# Patient Record
Sex: Male | Born: 1955
Health system: Southern US, Community
[De-identification: ages and names within clinical notes are randomized; demographics above are authoritative.]

## PROBLEM LIST (undated history)

## (undated) DIAGNOSIS — E785 Hyperlipidemia, unspecified: Secondary | ICD-10-CM

## (undated) DIAGNOSIS — F1020 Alcohol dependence, uncomplicated: Secondary | ICD-10-CM

## (undated) DIAGNOSIS — G4730 Sleep apnea, unspecified: Secondary | ICD-10-CM

## (undated) DIAGNOSIS — E669 Obesity, unspecified: Secondary | ICD-10-CM

## (undated) DIAGNOSIS — I10 Essential (primary) hypertension: Secondary | ICD-10-CM

## (undated) DIAGNOSIS — K219 Gastro-esophageal reflux disease without esophagitis: Secondary | ICD-10-CM

## (undated) HISTORY — DX: Essential (primary) hypertension: I10

## (undated) HISTORY — DX: Sleep apnea, unspecified: G47.30

## (undated) HISTORY — PX: COLONOSCOPY: SHX174

## (undated) HISTORY — DX: Hyperlipidemia, unspecified: E78.5

## (undated) HISTORY — DX: Gastro-esophageal reflux disease without esophagitis: K21.9

## (undated) HISTORY — DX: Obesity, unspecified: E66.9

## (undated) HISTORY — DX: Alcohol dependence, uncomplicated: F10.20

---

## 1996-11-29 HISTORY — PX: HERNIA REPAIR: SHX51

## 2010-11-29 LAB — HM COLONOSCOPY

## 2011-09-07 LAB — HEMOCCULT GUIAC POC 1CARD (OFFICE)

## 2011-09-15 ENCOUNTER — Encounter: Payer: Self-pay | Admitting: Internal Medicine

## 2011-09-16 ENCOUNTER — Encounter: Payer: Self-pay | Admitting: Internal Medicine

## 2011-09-20 ENCOUNTER — Encounter: Payer: Self-pay | Admitting: Internal Medicine

## 2011-09-21 ENCOUNTER — Encounter: Payer: Self-pay | Admitting: Internal Medicine

## 2011-09-21 ENCOUNTER — Ambulatory Visit (INDEPENDENT_AMBULATORY_CARE_PROVIDER_SITE_OTHER): Payer: No Typology Code available for payment source | Admitting: Internal Medicine

## 2011-09-21 VITALS — BP 120/68 | HR 74 | Ht 69.0 in | Wt 242.0 lb

## 2011-09-21 DIAGNOSIS — F1011 Alcohol abuse, in remission: Secondary | ICD-10-CM | POA: Insufficient documentation

## 2011-09-21 DIAGNOSIS — I10 Essential (primary) hypertension: Secondary | ICD-10-CM | POA: Insufficient documentation

## 2011-09-21 DIAGNOSIS — E118 Type 2 diabetes mellitus with unspecified complications: Secondary | ICD-10-CM | POA: Insufficient documentation

## 2011-09-21 DIAGNOSIS — E785 Hyperlipidemia, unspecified: Secondary | ICD-10-CM | POA: Insufficient documentation

## 2011-09-21 DIAGNOSIS — G4730 Sleep apnea, unspecified: Secondary | ICD-10-CM | POA: Insufficient documentation

## 2011-09-21 DIAGNOSIS — R197 Diarrhea, unspecified: Secondary | ICD-10-CM

## 2011-09-21 DIAGNOSIS — F1911 Other psychoactive substance abuse, in remission: Secondary | ICD-10-CM | POA: Insufficient documentation

## 2011-09-21 MED ORDER — PEG-KCL-NACL-NASULF-NA ASC-C 100 G PO SOLR: 1.0000 | Freq: Once | ORAL | Status: DC

## 2011-09-21 MED ORDER — LOPERAMIDE HCL 2 MG PO TABS: 2.0000 mg | ORAL_TABLET | Freq: Three times a day (TID) | ORAL | Status: AC | PRN

## 2011-09-21 NOTE — Patient Instructions (Signed)
You have been scheduled for a colonoscopy. Please follow written instructions given to you at your visit today.  Please pick up your prep kit at the pharmacy within the next 2-3 days.  Dr. Rhea Belton would like you to increase your Miralax to three times daily as needed. You can take up to 16mg  daily for loose stool.

## 2011-09-21 NOTE — Progress Notes (Signed)
Subjective:    Patient ID: Patrick Parker, male    DOB: 1956-03-07, 55 y.o.   MRN: [id number]  HPI Patrick Parker is a 55 yo male with PMH of alcoholism/substance abuse in remission x 20 years, hypertension, hyperlipidemia, diabetes, sleep apnea who is seen in consultation at the request of Dr. Alycia Rossetti for evaluation of diarrhea.  The patient states that he's had approximately 8 weeks of diarrhea. He reports this has been life limiting and is occurring 12-14 times during the daytime. He also is happening at night, and he is having between 3 and 6 nocturnal stools. This is interfering with his sleep and caused significant fatigue. He reports his stools are "mustard yellow" but he does occasionally see scant bright red blood which he has attributed to hemorrhoids. He denies melena and hematochezia. He's had no specific abdominal pain with diarrhea, though he does note occasional mild lower cramping. He denies nausea and vomiting. No heartburn, dysphagia or odynophagia. He feels that the diarrhea is specifically worse after eating, and often he will have to rush to the bathroom after eating. He reports about 8 weeks ago he briefly changed his diet in attempt to lose weight and started taking vitamins and supplements from [facility]. He felt like the diarrhea may be related to the diet change and vitamins, thus he discontinued these several weeks ago and the diarrhea has continued. He notes about a 20 pound weight loss and feels maybe 10 pounds of this was intentional. He has used Imodium but only once daily. He feels this helps some but not significantly over the whole day or night. He reports several antibiotic exposures over the last 6-12 months but none just before the diarrhea started.   Review of Systems Constitutional: Negative for fever, chills, night sweats, activity change, appetite change, + fatigue HEENT: Negative for sore throat, mouth sores and trouble swallowing. Eyes: Negative for visual  disturbance Respiratory: Negative for cough, chest tightness, positive for occasional shortness of breath Cardiovascular: Negative for chest pain, palpitations and lower extremity swelling Gastrointestinal: See history of present illness Genitourinary: Negative for dysuria and hematuria. Musculoskeletal: Negative for back pain, arthralgias and myalgias Skin: Negative for rash or color change Neurological: Negative for headaches, weakness, numbness Hematological: Negative for adenopathy, negative for easy bruising/bleeding Psychiatric/behavioral: Negative for depressed mood, negative for anxiety, positive for sleep problem   Past Medical History  Diagnosis Date  . Sleep apnea   . Diabetes mellitus   . Obesity   . Hypertension   . Hyperlipemia   . Alcoholism    Current Outpatient Prescriptions  Medication Sig Dispense Refill  . amLODipine (NORVASC) 10 MG tablet Take 10 mg by mouth daily.        Marland Kitchen aspirin 325 MG tablet Take 325 mg by mouth daily.        Marland Kitchen atenolol (TENORMIN) 50 MG tablet Take 50 mg by mouth daily.        . benazepril (LOTENSIN) 20 MG tablet Take 20 mg by mouth daily.        . Fish Oil OIL Take by mouth. Takes one tablet by mouth as directed       . glipiZIDE (GLUCOTROL) 10 MG tablet Take 10 mg by mouth daily.        . hydrochlorothiazide (HYDRODIURIL) 25 MG tablet Take 25 mg by mouth daily.        Marland Kitchen loperamide (IMODIUM A-D) 2 MG tablet Take 1 tablet (2 mg total) by mouth 3 (three) times daily as  needed (Take as needed up to 16mg  daily for loose stool).  30 tablet  0  . Melatonin 10 MG TABS Take 3 tablets by mouth daily.        . metFORMIN (GLUCOPHAGE) 1000 MG tablet Take 1,000 mg by mouth 2 (two) times daily with a meal.        . Saw Palmetto 450 MG CAPS Take by mouth. Takes one tablet by mouth as directed       . zolpidem (AMBIEN) 10 MG tablet Take 10 mg by mouth at bedtime as needed.        . peg 3350 powder (MOVIPREP) 100 G SOLR Take 1 kit (100 g total) by mouth  once.  1 kit  0   No Known Allergies  Family History  Problem Relation Age of Onset  . Prostate cancer Father   . Colon cancer Neg Hx   --neg for GI malignancy   Social History  . 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 Smoking status: Never Smoker   . Smokeless tobacco: Current User  . Alcohol Use: No     Was a heavy drinker. Quit 20 yrs ago   . Drug Use: No, previous cocaine use/abuse - none x 20 yrs   Social History Narrative   5 caffeine drinks daily       Objective:   Physical Exam BP 120/68  Pulse 74  Ht 5\' 9"  (1.753 m)  Wt 242 lb (109.77 kg)  BMI 35.74 kg/m2  SpO2 96% Constitutional: Well-developed and well-nourished. No distress. HEENT: Normocephalic and atraumatic. Oropharynx is clear and moist. No oropharyngeal exudate. Conjunctivae are normal. Pupils are equal round and reactive to light. No scleral icterus. Neck: Neck supple. Trachea midline. Cardiovascular: Normal rate, regular rhythm and intact distal pulses. No M/R/G Pulmonary/chest: Effort normal and breath sounds normal. No wheezing, rales or rhonchi. Abdominal: Soft, nontender, nondistended. Bowel sounds active throughout. There are no masses palpable. No hepatosplenomegaly. Extremities: no clubbing, cyanosis, or edema Lymphadenopathy: No cervical adenopathy noted. Neurological: Alert and oriented to person place and time. Skin: Skin is warm and dry. No rashes noted. Psychiatric: Normal mood and affect. Behavior is normal.  Labs: obtained from Dr. Daphane Shepherd [facility]) Stool studies negative for bacteria (Salmonella, Shigella, Campylobacter, Escherichia coli), C. difficile toxin negative, ova and parasite exam negative     Assessment & Plan:   55 yo male with PMH of alcoholism/substance abuse in remission x 20 years, hypertension, hyperlipidemia, diabetes, sleep apnea who is seen in consultation at the request of  Dr. Alycia Rossetti for evaluation of diarrhea.  1. Diarrhea -- the patient has now had slightly greater than 2 months of significant diarrhea, as many as 15 stools a day plus nocturnal stools. His stool studies were unrevealing and negative for infectious etiology. It is certainly possible that the over-the-counter vitamins/supplements were contributing to his diarrhea however his symptoms have not abated with cessation of these medications. At this point it is reasonable to proceed with colonoscopy for further evaluation possible diagnosis of his diarrhea. Microscopic colitis is in the differential and therefore random biopsies will be obtained if the colon is endoscopically normal in appearance.  I've advised that he increase the use of loperamide in an attempt to curb his diarrhea. He'll use 2 mg 3 times a day before meals and at bedtime. He can back off of these doses if he  becomes constipated. I've advised that he take no more than 16 mg on a daily basis. He is satisfied agreeable to this plan.  Further recommendations and followup will be based on findings at colonoscopy

## 2011-10-05 ENCOUNTER — Other Ambulatory Visit: Payer: Self-pay | Admitting: Internal Medicine

## 2011-10-05 DIAGNOSIS — R197 Diarrhea, unspecified: Secondary | ICD-10-CM

## 2011-10-05 MED ORDER — PEG-KCL-NACL-NASULF-NA ASC-C 100 G PO SOLR: 1.0000 | Freq: Once | ORAL | Status: DC

## 2011-10-05 NOTE — Telephone Encounter (Signed)
Moviprep was resent to the pharmacy.  Pt notified

## 2011-10-12 ENCOUNTER — Ambulatory Visit (AMBULATORY_SURGERY_CENTER): Payer: No Typology Code available for payment source | Admitting: Internal Medicine

## 2011-10-12 ENCOUNTER — Encounter: Payer: Self-pay | Admitting: Internal Medicine

## 2011-10-12 VITALS — BP 131/78 | HR 78 | Temp 98.0°F | Resp 19 | Ht 69.0 in | Wt 242.0 lb

## 2011-10-12 DIAGNOSIS — K5289 Other specified noninfective gastroenteritis and colitis: Secondary | ICD-10-CM

## 2011-10-12 DIAGNOSIS — R197 Diarrhea, unspecified: Secondary | ICD-10-CM

## 2011-10-12 DIAGNOSIS — D126 Benign neoplasm of colon, unspecified: Secondary | ICD-10-CM

## 2011-10-12 MED ORDER — SODIUM CHLORIDE 0.9 % IV SOLN: 500.0000 mL | INTRAVENOUS | Status: DC

## 2011-10-12 NOTE — Patient Instructions (Signed)
Please refer to blue and green discharge instruction sheets. 

## 2011-10-13 ENCOUNTER — Telehealth: Payer: Self-pay | Admitting: *Deleted

## 2011-10-13 NOTE — Telephone Encounter (Signed)

## 2011-10-19 ENCOUNTER — Other Ambulatory Visit: Payer: Self-pay | Admitting: Internal Medicine

## 2011-10-19 ENCOUNTER — Other Ambulatory Visit: Payer: Self-pay | Admitting: *Deleted

## 2011-10-19 ENCOUNTER — Telehealth: Payer: Self-pay | Admitting: *Deleted

## 2011-10-19 MED ORDER — BUDESONIDE 3 MG PO CP24: ORAL_CAPSULE | ORAL | Status: DC

## 2011-10-19 NOTE — Telephone Encounter (Signed)
lmom for pt to call back. Per Dr Rhea Belton, he has Lymphocytic Colitis which means inflammation in the colon just under the surface of the mucosa which causes the diarrhea. The diarrhea waxes and wanes. Dr Rhea Belton knows the pt has been using Imodium and he may continue if he has good results. If the Imodium isn't effective, we can place him on Budesonide, a taper over 2 months: 9mg  daily for 1 month, then 6mg  daily for 2 weeks and 3mg  x 2 weeks. The budesonide is a steroid, but 95% stays local to the gut. The colon polyp path was ok, but he will need a repeat colon in 5 years.

## 2011-10-19 NOTE — Telephone Encounter (Signed)
Informed pt of Dr Lauro Franklin recommendations. Pt reports the Imodium isn't really helping him. Will order the budesonide; pt stated understanding.

## 2011-10-27 ENCOUNTER — Encounter: Payer: Self-pay | Admitting: Internal Medicine

## 2012-11-09 ENCOUNTER — Telehealth: Payer: Self-pay | Admitting: Internal Medicine

## 2012-11-09 NOTE — Telephone Encounter (Signed)
ok 
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
## 2012-11-10 NOTE — Telephone Encounter (Signed)
Pt is coming on Dec. 19.

## 2012-11-16 ENCOUNTER — Encounter: Payer: Self-pay | Admitting: Internal Medicine

## 2012-11-16 ENCOUNTER — Ambulatory Visit (INDEPENDENT_AMBULATORY_CARE_PROVIDER_SITE_OTHER): Payer: No Typology Code available for payment source | Admitting: Internal Medicine

## 2012-11-16 ENCOUNTER — Other Ambulatory Visit (INDEPENDENT_AMBULATORY_CARE_PROVIDER_SITE_OTHER): Payer: No Typology Code available for payment source

## 2012-11-16 VITALS — BP 120/72 | HR 78 | Temp 98.0°F | Resp 16 | Ht 69.0 in | Wt 245.5 lb

## 2012-11-16 DIAGNOSIS — R1310 Dysphagia, unspecified: Secondary | ICD-10-CM

## 2012-11-16 DIAGNOSIS — E119 Type 2 diabetes mellitus without complications: Secondary | ICD-10-CM

## 2012-11-16 DIAGNOSIS — E785 Hyperlipidemia, unspecified: Secondary | ICD-10-CM

## 2012-11-16 DIAGNOSIS — I10 Essential (primary) hypertension: Secondary | ICD-10-CM

## 2012-11-16 DIAGNOSIS — K219 Gastro-esophageal reflux disease without esophagitis: Secondary | ICD-10-CM | POA: Insufficient documentation

## 2012-11-16 LAB — COMPREHENSIVE METABOLIC PANEL
ALT: 50 U/L (ref 0–53)
AST: 27 U/L (ref 0–37)
Albumin: 4 g/dL (ref 3.5–5.2)
Alkaline Phosphatase: 71 U/L (ref 39–117)
BUN: 19 mg/dL (ref 6–23)
CO2: 28 mEq/L (ref 19–32)
Calcium: 9.4 mg/dL (ref 8.4–10.5)
Chloride: 103 mEq/L (ref 96–112)
Creatinine, Ser: 1.1 mg/dL (ref 0.4–1.5)
GFR: 74.24 mL/min (ref >60.00)
Glucose, Bld: 269 mg/dL — ABNORMAL HIGH (ref 70–99)
Potassium: 4 mEq/L (ref 3.5–5.1)
Sodium: 137 mEq/L (ref 135–145)
Total Bilirubin: 0.4 mg/dL (ref 0.3–1.2)
Total Protein: 7.3 g/dL (ref 6.0–8.3)

## 2012-11-16 LAB — CBC WITH DIFFERENTIAL/PLATELET
Basophils Absolute: 0.1 10*3/uL (ref 0.0–0.1)
Basophils Relative: 0.6 % (ref 0.0–3.0)
Eosinophils Absolute: 0.2 10*3/uL (ref 0.0–0.7)
Eosinophils Relative: 2.1 % (ref 0.0–5.0)
HCT: 46.1 % (ref 39.0–52.0)
Hemoglobin: 15.7 g/dL (ref 13.0–17.0)
Lymphocytes Relative: 19.4 % (ref 12.0–46.0)
Lymphs Abs: 1.5 10*3/uL (ref 0.7–4.0)
MCHC: 34 g/dL (ref 30.0–36.0)
MCV: 92.3 fl (ref 78.0–100.0)
Monocytes Absolute: 0.6 10*3/uL (ref 0.1–1.0)
Monocytes Relative: 7.7 % (ref 3.0–12.0)
Neutro Abs: 5.5 10*3/uL (ref 1.4–7.7)
Neutrophils Relative %: 70.2 % (ref 43.0–77.0)
Platelets: 325 10*3/uL (ref 150.0–400.0)
RBC: 4.99 Mil/uL (ref 4.22–5.81)
RDW: 13.1 % (ref 11.5–14.6)
WBC: 7.9 10*3/uL (ref 4.5–10.5)

## 2012-11-16 LAB — URINALYSIS, ROUTINE W REFLEX MICROSCOPIC
Bilirubin Urine: NEGATIVE
Hgb urine dipstick: NEGATIVE
Ketones, ur: NEGATIVE
Leukocytes, UA: NEGATIVE
Nitrite: NEGATIVE
Specific Gravity, Urine: 1.015 (ref 1.000–1.030)
Total Protein, Urine: NEGATIVE
Urine Glucose: >=1000
Urobilinogen, UA: 0.2 (ref 0.0–1.0)
pH: 6 (ref 5.0–8.0)

## 2012-11-16 LAB — CK: Total CK: 89 U/L (ref 7–232)

## 2012-11-16 LAB — HEMOGLOBIN A1C: Hgb A1c MFr Bld: 8.8 % — ABNORMAL HIGH (ref 4.6–6.5)

## 2012-11-16 LAB — HM DIABETES FOOT EXAM: HM Diabetic Foot Exam: NORMAL

## 2012-11-16 LAB — TSH: TSH: 2.54 u[IU]/mL (ref 0.35–5.50)

## 2012-11-16 MED ORDER — ESOMEPRAZOLE MAGNESIUM 40 MG PO CPDR: 40.0000 mg | DELAYED_RELEASE_CAPSULE | Freq: Every day | ORAL | Status: DC

## 2012-11-16 NOTE — Patient Instructions (Signed)

## 2012-11-16 NOTE — Progress Notes (Signed)
Subjective:    Patient ID: Patrick Parker, male    DOB: 1955/12/11, 56 y.o.   MRN: [id number]  Gastrophageal Reflux He complains of choking and heartburn. He reports no abdominal pain, no belching, no chest pain, no coughing, no dysphagia, no early satiety, no globus sensation, no hoarse voice, no nausea, no sore throat, no stridor, no tooth decay, no water brash or no wheezing. This is a chronic problem. The current episode started more than 1 year ago. The problem occurs occasionally. The problem has been gradually worsening. The heartburn duration is several minutes. The heartburn is located in the substernum. The heartburn is of moderate intensity. The heartburn wakes him from sleep. The heartburn does not limit his activity. The heartburn doesn't change with position. Pertinent negatives include no anemia, fatigue, melena, muscle weakness, orthopnea or weight loss. He has tried an antacid for the symptoms. The treatment provided moderate relief.  Diabetes He presents for his follow-up diabetic visit. He has type 2 diabetes mellitus. His disease course has been stable. Hypoglycemia symptoms include nervousness/anxiousness. Pertinent negatives for hypoglycemia include no confusion, dizziness or pallor. Associated symptoms include polydipsia, polyphagia and polyuria. Pertinent negatives for diabetes include no blurred vision, no chest pain, no fatigue, no foot paresthesias, no foot ulcerations, no visual change, no weakness and no weight loss. There are no hypoglycemic complications. There are no diabetic complications. He is compliant with treatment most of the time. His weight is increasing steadily. He is following a generally unhealthy diet. When asked about meal planning, he reported none. He never participates in exercise. There is no change in his home blood glucose trend. An ACE inhibitor/angiotensin II receptor blocker is being taken. He does not see a podiatrist.Eye exam is not current.       Review of Systems  Constitutional: Negative for fever, chills, weight loss, diaphoresis, activity change, appetite change, fatigue and unexpected weight change.  HENT: Negative.  Negative for sore throat and hoarse voice.   Eyes: Negative.  Negative for blurred vision.  Respiratory: Positive for apnea and choking. Negative for cough, chest tightness, shortness of breath, wheezing and stridor.   Cardiovascular: Negative for chest pain, palpitations and leg swelling.  Gastrointestinal: Positive for heartburn. Negative for dysphagia, nausea, vomiting, abdominal pain, diarrhea, constipation, blood in stool, melena, abdominal distention, anal bleeding and rectal pain.  Genitourinary: Positive for polyuria.  Musculoskeletal: Negative for myalgias, back pain, joint swelling, arthralgias, gait problem and muscle weakness.  Skin: Negative for color change, pallor, rash and wound.  Neurological: Negative.  Negative for dizziness, syncope, weakness and light-headedness.  Hematological: Positive for polydipsia and polyphagia. Negative for adenopathy. Does not bruise/bleed easily.  Psychiatric/Behavioral: Positive for sleep disturbance. Negative for suicidal ideas, hallucinations, behavioral problems, confusion, self-injury, dysphoric mood, decreased concentration and agitation. The patient is nervous/anxious. The patient is not hyperactive.        Objective:   Physical Exam  Vitals reviewed. Constitutional: He is oriented to person, place, and time. He appears well-developed and well-nourished.  Non-toxic appearance. He does not have a sickly appearance. He does not appear ill. No distress.  HENT:  Head: Normocephalic and atraumatic.  Mouth/Throat: Oropharynx is clear and moist. No oropharyngeal exudate.  Eyes: Conjunctivae normal are normal. Right eye exhibits no discharge. Left eye exhibits no discharge. No scleral icterus.  Neck: Normal range of motion. Neck supple. No JVD present. No  tracheal deviation present. No thyromegaly present.  Cardiovascular: Normal rate, regular rhythm, normal heart sounds and intact distal pulses.  Exam reveals no gallop and no friction rub.   No murmur heard. Pulmonary/Chest: Effort normal and breath sounds normal. No stridor. No respiratory distress. He has no wheezes. He has no rales. He exhibits no tenderness.  Abdominal: Soft. Bowel sounds are normal. He exhibits no distension. There is no tenderness. There is no rebound and no guarding. Hernia confirmed negative in the right inguinal area and confirmed negative in the left inguinal area.  Genitourinary: Prostate normal, testes normal and penis normal. Rectal exam shows no external hemorrhoid, no internal hemorrhoid, no fissure, no mass, no tenderness and anal tone normal. Guaiac positive stool. Prostate is not enlarged and not tender. Right testis shows no mass, no swelling and no tenderness. Right testis is descended. Left testis shows no mass, no swelling and no tenderness. Left testis is descended. Circumcised. No penile erythema or penile tenderness. No discharge found.  Musculoskeletal: Normal range of motion. He exhibits no edema and no tenderness.  Lymphadenopathy:    He has no cervical adenopathy.       Right: No inguinal adenopathy present.       Left: No inguinal adenopathy present.  Neurological: He is oriented to person, place, and time.  Skin: Skin is warm and dry. No rash noted. He is not diaphoretic. No erythema. No pallor.  Psychiatric: He has a normal mood and affect. His behavior is normal. Judgment and thought content normal.      No results found for this basename: WBC, HGB, HCT, PLT, GLUCOSE, CHOL, TRIG, HDL, LDLDIRECT, LDLCALC, ALT, AST, NA, K, CL, CREATININE, BUN, CO2, TSH, PSA, INR, GLUF, HGBA1C, MICROALBUR      Assessment & Plan:

## 2012-11-19 ENCOUNTER — Encounter: Payer: Self-pay | Admitting: Internal Medicine

## 2012-11-19 NOTE — Assessment & Plan Note (Signed)
I will check his CK level today
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
## 2012-11-19 NOTE — Assessment & Plan Note (Signed)
Start nexium He has s/s of UGI pathology so I have asked him to see GI to see if he needs to have an EGD done
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
## 2012-11-19 NOTE — Assessment & Plan Note (Signed)
He has adequate BP control Today I will check his lytes and renal function 

## 2012-11-20 ENCOUNTER — Other Ambulatory Visit: Payer: Self-pay | Admitting: Internal Medicine

## 2012-12-05 ENCOUNTER — Encounter: Payer: Self-pay | Admitting: *Deleted

## 2012-12-05 ENCOUNTER — Encounter: Payer: No Typology Code available for payment source | Attending: Internal Medicine | Admitting: *Deleted

## 2012-12-05 VITALS — Ht 69.0 in | Wt 243.2 lb

## 2012-12-05 DIAGNOSIS — Z713 Dietary counseling and surveillance: Secondary | ICD-10-CM | POA: Insufficient documentation

## 2012-12-05 DIAGNOSIS — E119 Type 2 diabetes mellitus without complications: Secondary | ICD-10-CM | POA: Insufficient documentation

## 2012-12-05 DIAGNOSIS — IMO0001 Reserved for inherently not codable concepts without codable children: Secondary | ICD-10-CM

## 2012-12-05 LAB — HM DIABETES EYE EXAM

## 2012-12-05 NOTE — Progress Notes (Addendum)
  Medical Nutrition Therapy:  Appt start time: 0900   End time: 1000.  Assessment:  T2DM (250.00) Frazier comes today for DM education after recent A1c of 8.8% (11/16/12). Reports h/o excessive CHO intake that he and his wife have changed in last few weeks. Eats mainly grilled or baked lean protein with vegetables and drinks water.  He is on the road a lot, however, and must eat fast food as well. Reports making better choices now. No exercise noted. Discussed how it effects BG and encouraged to start.   Average FBG (per pt): 138-147 mg Previous average FBG: 250-310 mg (1.5 mos ago)   Lab Results  Component Value Date   HGBA1C 8.8* 11/16/2012    DM Self-Mgmt Assessment 12/05/2012  Time since Diabetes Dx ~ 8-10 years  Type of Diabetes Type 2  Previous DM Education? No  Work/school missed in year for illness None  Personal health rating (1 = best) 3  How often is MD seen for diabetes? Every 6 months  Hospitalizations in past year 1  Do you check your glucose? Yes  How often? FBG daily  Do you check your feet? No  Do you exercise? Yes  How often? Walks 2 times/week when weather is good  Do you take aspirin? Yes  How often? Daily  Do you use alcohol? No  Do you use tobacco? Yes  How often? 1-2 cigars every other week  Eye exam within a year? Not in 2 years; planning to make appt asap  Do you follow a meal plan? No   MEDICATIONS: See medication list. Reconciled with patient at visit.    DIETARY INTAKE:  Usual eating pattern includes 3 meals and 0-2 snacks per day.  24-hr recall:  B (AM): Tomato and pepper ommlette; 1 cup coffee   Snk ( AM): NONE  L ( PM):  6" tuna & veggie sub on whole wheat; water w/ Mio Snk ( PM): NONE D ( PM): Baked chicken, green beans, corn; water w/ Mio Snk ( PM):  SF ice cream (2x/wk); Whole milk with 6-8 peanut butter cookies (2-3x/wk)  Usual physical activity: None at this time.   Estimated energy needs: 1600-1700 calories (for wt loss) 180-190 g  carbohydrates 110-125 g protein 45-50 g fat  Progress Towards Goal(s):  In progress.   Nutritional Diagnosis:  [place]-2.1 Impaired nutrient utilization related to glucose metabolism as evidenced by patient reported food intake history and a recent A1c of 8.8%.    Intervention:  Nutrition education regarding CHO metabolism, CHO counting, and meal planning.  Goals:  Follow Diabetes Meal Plan as instructed (yellow card)  Eat 3 meals and 2 snacks, every 3-5 hrs  Limit carbohydrate intake to 45-60 grams/meal and 15 grams/snack  Add lean protein foods to all meals/snacks  Aim for 30-60 mins of physical activity daily   Handouts given during visit include:  Living Well with Diabetes - [employer]  Low CHO Snack List  Mr. Idell Pickles Quick and Easy Diabetic Cookbook  Carb Counting and Meal Planning - novo nordisk  Samples given during visit include:   OneTouch Ultra Strips: 1 box/10 strips Lot # [id number]; Exp: 04/14  Monitoring/Evaluation:  Dietary intake, exercise, A1c, BG trends, and body weight in 6 week(s).

## 2012-12-05 NOTE — Patient Instructions (Addendum)
Goals:  Follow Diabetes Meal Plan as instructed (yellow card)  Eat 3 meals and 2 snacks, every 3-5 hrs  Limit carbohydrate intake to 45-60 grams/meal and 15 grams/snack  Add lean protein foods to all meals/snacks  Aim for 30-60 mins of physical activity daily

## 2012-12-13 ENCOUNTER — Other Ambulatory Visit: Payer: Self-pay

## 2012-12-13 MED ORDER — SIMVASTATIN 20 MG PO TABS: 20.0000 mg | ORAL_TABLET | Freq: Every evening | ORAL | Status: DC

## 2012-12-25 ENCOUNTER — Ambulatory Visit: Payer: No Typology Code available for payment source | Admitting: Internal Medicine

## 2013-01-08 ENCOUNTER — Other Ambulatory Visit: Payer: Self-pay | Admitting: Internal Medicine

## 2013-01-16 ENCOUNTER — Ambulatory Visit: Payer: No Typology Code available for payment source | Admitting: *Deleted

## 2013-01-24 ENCOUNTER — Other Ambulatory Visit: Payer: Self-pay | Admitting: Internal Medicine

## 2013-01-25 ENCOUNTER — Encounter: Payer: No Typology Code available for payment source | Attending: Internal Medicine | Admitting: *Deleted

## 2013-01-25 ENCOUNTER — Encounter: Payer: Self-pay | Admitting: *Deleted

## 2013-01-25 VITALS — Ht 69.0 in | Wt 234.9 lb

## 2013-01-25 DIAGNOSIS — IMO0001 Reserved for inherently not codable concepts without codable children: Secondary | ICD-10-CM

## 2013-01-25 DIAGNOSIS — E119 Type 2 diabetes mellitus without complications: Secondary | ICD-10-CM | POA: Insufficient documentation

## 2013-01-25 DIAGNOSIS — Z713 Dietary counseling and surveillance: Secondary | ICD-10-CM | POA: Insufficient documentation

## 2013-01-25 NOTE — Progress Notes (Signed)
  Medical Nutrition Therapy:  Appt start time: 0900   End time:  0930.  Primary Concerns Today:  T2DM (250.00), F/U Patrick Parker returns for f/u with an 8 lb wt loss since last visit (12/05/12).  Has stopped sweets, sugary drinks, cakes.  Drinking only water and eating lean protein. Exercising 4 days/week and reports he has gone from a 44 waist to a 40. Feels much better.   Recent FBG (per pt): 95, 98, 107 to 130 mg; One outlier of 170 mg   Lab Results  Component Value Date   HGBA1C 8.8* 11/16/2012   MEDICATIONS: See medication list. Reconciled with patient at visit.    DIETARY INTAKE:  Usual eating pattern includes 3 meals and 1-2 snacks per day.  24-hr recall:  B (AM): 2 boiled eggs, 2 pc [place] sausage or [place] Yogurt, ; 1 cup coffee   Snk ( AM): Tuna and few crackers snack packs L ( PM):  Subway salad; water w/ Mio Snk ( PM): NONE D ( PM): Baked chicken, green beans, corn; water w/ Mio Snk ( PM):  SF ice cream (2x/wk) Beverages:  4-6 bottles water daily  Usual physical activity: Walks 45 min, 4 days/week   Estimated energy needs: 1600-1700 calories (for wt loss) 180-190 g carbohydrates 110-125 g protein 45-50 g fat  Progress Towards Goal(s):  In progress.   Nutritional Diagnosis:  [place]-2.1 Impaired nutrient utilization related to glucose metabolism as evidenced by patient reported food intake history and a recent A1c of 8.8%.    Intervention:  Nutrition education regarding CHO metabolism, CHO counting, and meal planning.  Goals:  Continue to follow Diabetes Meal Plan as instructed (yellow card)  Eat 3 meals and 2 snacks, every 3-5 hrs  Limit carbohydrate intake to 45-60 grams/meal and 15 grams/snack  Add lean protein foods to all meals/snacks  Aim for 30-60 mins of physical activity daily   Monitoring/Evaluation:  Dietary intake, exercise, A1c, BG trends, and body weight in 6 months or prn.

## 2013-01-25 NOTE — Patient Instructions (Addendum)
Goals:  Continue to follow Diabetes Meal Plan as instructed (yellow card)  Eat 3 meals and 2 snacks, every 3-5 hrs  Limit carbohydrate intake to 45-60 grams/meal and 15 grams/snack  Add lean protein foods to all meals/snacks  Aim for 30-60 mins of physical activity daily

## 2013-01-30 ENCOUNTER — Ambulatory Visit: Payer: No Typology Code available for payment source | Admitting: Internal Medicine

## 2013-02-05 ENCOUNTER — Ambulatory Visit (INDEPENDENT_AMBULATORY_CARE_PROVIDER_SITE_OTHER): Payer: No Typology Code available for payment source | Admitting: Internal Medicine

## 2013-02-05 ENCOUNTER — Encounter: Payer: Self-pay | Admitting: Internal Medicine

## 2013-02-05 ENCOUNTER — Other Ambulatory Visit (INDEPENDENT_AMBULATORY_CARE_PROVIDER_SITE_OTHER): Payer: No Typology Code available for payment source

## 2013-02-05 VITALS — BP 118/64 | HR 74 | Temp 98.3°F | Resp 16 | Ht 69.0 in | Wt 236.0 lb

## 2013-02-05 DIAGNOSIS — IMO0001 Reserved for inherently not codable concepts without codable children: Secondary | ICD-10-CM

## 2013-02-05 DIAGNOSIS — E785 Hyperlipidemia, unspecified: Secondary | ICD-10-CM

## 2013-02-05 DIAGNOSIS — I10 Essential (primary) hypertension: Secondary | ICD-10-CM

## 2013-02-05 LAB — BASIC METABOLIC PANEL
BUN: 17 mg/dL (ref 6–23)
CO2: 27 mEq/L (ref 19–32)
Calcium: 9.6 mg/dL (ref 8.4–10.5)
Chloride: 106 mEq/L (ref 96–112)
Creatinine, Ser: 1 mg/dL (ref 0.4–1.5)
GFR: 81 mL/min (ref >60.00)
Glucose, Bld: 119 mg/dL — ABNORMAL HIGH (ref 70–99)
Potassium: 4 mEq/L (ref 3.5–5.1)
Sodium: 139 mEq/L (ref 135–145)

## 2013-02-05 LAB — HEMOGLOBIN A1C: Hgb A1c MFr Bld: 6.6 % — ABNORMAL HIGH (ref 4.6–6.5)

## 2013-02-05 LAB — LIPID PANEL
Cholesterol: 166 mg/dL (ref 0–200)
HDL: 34.1 mg/dL — ABNORMAL LOW (ref >39.00)
LDL Cholesterol: 104 mg/dL — ABNORMAL HIGH (ref 0–99)
Total CHOL/HDL Ratio: 5
Triglycerides: 140 mg/dL (ref 0.0–149.0)
VLDL: 28 mg/dL (ref 0.0–40.0)

## 2013-02-05 MED ORDER — SITAGLIPTIN PHOSPHATE 100 MG PO TABS: 100.0000 mg | ORAL_TABLET | Freq: Every day | ORAL | Status: DC

## 2013-02-05 MED ORDER — METFORMIN HCL 1000 MG PO TABS: 1000.0000 mg | ORAL_TABLET | Freq: Every day | ORAL | Status: DC

## 2013-02-05 MED ORDER — TEMAZEPAM 30 MG PO CAPS: ORAL_CAPSULE | ORAL | Status: DC

## 2013-02-05 MED ORDER — BENAZEPRIL HCL 20 MG PO TABS: 20.0000 mg | ORAL_TABLET | Freq: Every day | ORAL | Status: DC

## 2013-02-05 MED ORDER — ATENOLOL 50 MG PO TABS: 50.0000 mg | ORAL_TABLET | Freq: Every day | ORAL | Status: DC

## 2013-02-05 MED ORDER — SIMVASTATIN 20 MG PO TABS: 20.0000 mg | ORAL_TABLET | Freq: Every evening | ORAL | Status: DC

## 2013-02-05 MED ORDER — AMLODIPINE BESYLATE 10 MG PO TABS: ORAL_TABLET | ORAL | Status: DC

## 2013-02-05 NOTE — Patient Instructions (Signed)

## 2013-02-05 NOTE — Assessment & Plan Note (Signed)
I will check his A1C and will address if needed. Will also monitor his renal function today.

## 2013-02-05 NOTE — Progress Notes (Signed)
Subjective:    Patient ID: Patrick Parker, male    DOB: [birth year], 57 y.o.   MRN: [id number]  Diabetes He presents for his follow-up diabetic visit. He has type 2 diabetes mellitus. His disease course has been improving. Hypoglycemia symptoms include nervousness/anxiousness. Pertinent negatives for hypoglycemia include no confusion, dizziness, pallor or tremors. Pertinent negatives for diabetes include no blurred vision, no chest pain, no fatigue, no foot paresthesias, no foot ulcerations, no polydipsia, no polyphagia, no polyuria, no visual change, no weakness and no weight loss. There are no hypoglycemic complications. There are no diabetic complications. Current diabetic treatment includes oral agent (dual therapy). He is compliant with treatment all of the time. His weight is stable. His home blood glucose trend is decreasing steadily. His breakfast blood glucose range is generally 90-110 mg/dl. His lunch blood glucose range is generally 110-130 mg/dl. His dinner blood glucose range is generally 110-130 mg/dl. His highest blood glucose is 130-140 mg/dl. His overall blood glucose range is 110-130 mg/dl.      Review of Systems  Constitutional: Negative for fever, chills, weight loss, diaphoresis, activity change, appetite change, fatigue and unexpected weight change.  HENT: Negative.   Eyes: Negative.  Negative for blurred vision.  Respiratory: Negative for cough, chest tightness, shortness of breath, wheezing and stridor.   Cardiovascular: Negative for chest pain, palpitations and leg swelling.  Gastrointestinal: Negative for nausea, vomiting, abdominal pain, diarrhea and constipation.  Endocrine: Negative.  Negative for polydipsia, polyphagia and polyuria.  Genitourinary: Negative.   Musculoskeletal: Negative for myalgias, back pain, joint swelling, arthralgias and gait problem.  Skin: Negative for color change, pallor, rash and wound.  Allergic/Immunologic: Negative.   Neurological:  Negative for dizziness, tremors, weakness and light-headedness.  Hematological: Negative for adenopathy. Does not bruise/bleed easily.  Psychiatric/Behavioral: Positive for sleep disturbance. Negative for suicidal ideas, hallucinations, behavioral problems, confusion, self-injury, dysphoric mood, decreased concentration and agitation. The patient is nervous/anxious. The patient is not hyperactive.        Objective:   Physical Exam  Vitals reviewed. Constitutional: He is oriented to person, place, and time. He appears well-developed and well-nourished. No distress.  HENT:  Head: Normocephalic and atraumatic.  Mouth/Throat: Oropharynx is clear and moist. No oropharyngeal exudate.  Eyes: Conjunctivae are normal. Right eye exhibits no discharge. Left eye exhibits no discharge. No scleral icterus.  Neck: Normal range of motion. Neck supple. No JVD present. No tracheal deviation present. No thyromegaly present.  Cardiovascular: Normal rate, regular rhythm, normal heart sounds and intact distal pulses.  Exam reveals no gallop and no friction rub.   No murmur heard. Pulmonary/Chest: Effort normal and breath sounds normal. No stridor. No respiratory distress. He has no wheezes. He has no rales. He exhibits no tenderness.  Abdominal: Soft. Bowel sounds are normal. He exhibits no distension and no mass. There is no tenderness. There is no rebound and no guarding.  Musculoskeletal: Normal range of motion. He exhibits no edema and no tenderness.  Lymphadenopathy:    He has no cervical adenopathy.  Neurological: He is oriented to person, place, and time.  Skin: Skin is warm and dry. No rash noted. He is not diaphoretic. No erythema. No pallor.  Psychiatric: He has a normal mood and affect. His behavior is normal. Judgment and thought content normal.     Lab Results  Component Value Date   WBC 7.9 11/16/2012   HGB 15.7 11/16/2012   HCT 46.1 11/16/2012   PLT 325.0 11/16/2012   GLUCOSE 269*  11/16/2012  ALT 50 11/16/2012   AST 27 11/16/2012   NA 137 11/16/2012   K 4.0 11/16/2012   CL 103 11/16/2012   CREATININE 1.1 11/16/2012   BUN 19 11/16/2012   CO2 28 11/16/2012   TSH 2.54 11/16/2012   HGBA1C 8.8* 11/16/2012       Assessment & Plan:

## 2013-02-05 NOTE — Assessment & Plan Note (Signed)
FLP today 

## 2013-02-05 NOTE — Assessment & Plan Note (Signed)
His BP is well controlled 

## 2013-06-07 ENCOUNTER — Ambulatory Visit (INDEPENDENT_AMBULATORY_CARE_PROVIDER_SITE_OTHER): Payer: No Typology Code available for payment source | Admitting: Internal Medicine

## 2013-06-07 ENCOUNTER — Other Ambulatory Visit (INDEPENDENT_AMBULATORY_CARE_PROVIDER_SITE_OTHER): Payer: No Typology Code available for payment source

## 2013-06-07 ENCOUNTER — Encounter: Payer: Self-pay | Admitting: Internal Medicine

## 2013-06-07 VITALS — BP 138/80 | HR 67 | Temp 97.8°F | Resp 16 | Wt 231.0 lb

## 2013-06-07 DIAGNOSIS — M7711 Lateral epicondylitis, right elbow: Secondary | ICD-10-CM

## 2013-06-07 DIAGNOSIS — IMO0001 Reserved for inherently not codable concepts without codable children: Secondary | ICD-10-CM

## 2013-06-07 DIAGNOSIS — I10 Essential (primary) hypertension: Secondary | ICD-10-CM

## 2013-06-07 DIAGNOSIS — M7710 Lateral epicondylitis, unspecified elbow: Secondary | ICD-10-CM

## 2013-06-07 LAB — BASIC METABOLIC PANEL
BUN: 15 mg/dL (ref 6–23)
CO2: 33 mEq/L — ABNORMAL HIGH (ref 19–32)
Calcium: 9.3 mg/dL (ref 8.4–10.5)
Chloride: 102 mEq/L (ref 96–112)
Creatinine, Ser: 1 mg/dL (ref 0.4–1.5)
GFR: 85.79 mL/min (ref >60.00)
Glucose, Bld: 165 mg/dL — ABNORMAL HIGH (ref 70–99)
Potassium: 3.6 mEq/L (ref 3.5–5.1)
Sodium: 134 mEq/L — ABNORMAL LOW (ref 135–145)

## 2013-06-07 LAB — HEMOGLOBIN A1C: Hgb A1c MFr Bld: 7.7 % — ABNORMAL HIGH (ref 4.6–6.5)

## 2013-06-07 MED ORDER — CELECOXIB 200 MG PO CAPS: 200.0000 mg | ORAL_CAPSULE | Freq: Every day | ORAL | Status: DC

## 2013-06-07 MED ORDER — SITAGLIPTIN PHOSPHATE 100 MG PO TABS: 100.0000 mg | ORAL_TABLET | Freq: Every day | ORAL | Status: DC

## 2013-06-07 NOTE — Progress Notes (Signed)
Subjective:    Patient ID: Patrick Parker, male    DOB: July 07, 1956, 57 y.o.   MRN: [id number]  Arthritis Presents for initial visit. The disease course has been fluctuating. The condition has lasted for 2 months. He complains of pain. He reports no stiffness, joint swelling or joint warmth. Affected locations include the right elbow. His pain is at a severity of 2/10. Pertinent negatives include no diarrhea, dry eyes, dry mouth, dysuria, fatigue, fever, pain at night, pain while resting, Raynaud's syndrome, uveitis or weight loss. There is no history of rheumatoid arthritis.  His pertinent risk factors include overuse. Past treatments include nothing. The treatment provided no relief. Factors aggravating his arthritis include activity.      Review of Systems  Constitutional: Negative.  Negative for fever, weight loss and fatigue.  HENT: Negative.   Eyes: Negative.   Respiratory: Negative.  Negative for cough, chest tightness, shortness of breath, wheezing and stridor.   Cardiovascular: Negative.  Negative for chest pain, palpitations and leg swelling.  Gastrointestinal: Negative.  Negative for abdominal pain and diarrhea.  Endocrine: Negative.  Negative for polydipsia, polyphagia and polyuria.  Genitourinary: Negative.  Negative for dysuria.  Musculoskeletal: Positive for arthralgias and arthritis. Negative for myalgias, joint swelling, gait problem and stiffness.  Skin: Negative.   Allergic/Immunologic: Negative.   Neurological: Negative.  Negative for dizziness, tremors and weakness.  Hematological: Negative.  Negative for adenopathy. Does not bruise/bleed easily.  Psychiatric/Behavioral: Negative.        Objective:   Physical Exam  Vitals reviewed. Constitutional: He is oriented to person, place, and time. He appears well-developed and well-nourished. No distress.  HENT:  Head: Normocephalic and atraumatic.  Mouth/Throat: Oropharynx is clear and moist. No oropharyngeal exudate.   Eyes: Conjunctivae are normal. Right eye exhibits no discharge. Left eye exhibits no discharge. No scleral icterus.  Neck: Normal range of motion. Neck supple. No JVD present. No tracheal deviation present. No thyromegaly present.  Cardiovascular: Normal rate, regular rhythm, normal heart sounds and intact distal pulses.  Exam reveals no friction rub.   No murmur heard. Pulmonary/Chest: Effort normal and breath sounds normal. No stridor. No respiratory distress. He has no wheezes. He has no rales. He exhibits no tenderness.  Abdominal: Soft. Bowel sounds are normal. He exhibits no distension and no mass. There is no tenderness. There is no rebound and no guarding.  Musculoskeletal: Normal range of motion. He exhibits no edema and no tenderness.       Right elbow: He exhibits normal range of motion, no swelling, no effusion, no deformity and no laceration. Tenderness found. Lateral epicondyle tenderness noted. No radial head, no medial epicondyle and no olecranon process tenderness noted.  Lymphadenopathy:    He has no cervical adenopathy.  Neurological: He is oriented to person, place, and time.  Skin: Skin is warm and dry. No rash noted. He is not diaphoretic. No erythema. No pallor.  Psychiatric: He has a normal mood and affect. His behavior is normal. Judgment and thought content normal.     Lab Results  Component Value Date   WBC 7.9 11/16/2012   HGB 15.7 11/16/2012   HCT 46.1 11/16/2012   PLT 325.0 11/16/2012   GLUCOSE 119* 02/05/2013   CHOL 166 02/05/2013   TRIG 140.0 02/05/2013   HDL 34.10* 02/05/2013   LDLCALC 104* 02/05/2013   ALT 50 11/16/2012   AST 27 11/16/2012   NA 139 02/05/2013   K 4.0 02/05/2013   CL 106 02/05/2013   CREATININE  1.0 02/05/2013   BUN 17 02/05/2013   CO2 27 02/05/2013   TSH 2.54 11/16/2012   HGBA1C 6.6* 02/05/2013       Assessment & Plan:

## 2013-06-07 NOTE — Assessment & Plan Note (Signed)
Start celebrex He deferred on getting a steroid injection He was given pt ed material

## 2013-06-07 NOTE — Patient Instructions (Signed)
Tennis Elbow  Your caregiver has diagnosed you with a condition often referred to as "tennis elbow." This results from small tears or soreness (inflammation) at the start (origin) of the extensor muscles of the forearm. Although the condition is often called tennis or golfer's elbow, it is caused by any repetitive action performed by your elbow.  HOME CARE INSTRUCTIONS   If the condition has been short lived, rest may be the only treatment required. Using your opposite hand or arm to perform the task may help. Even changing your grip may help rest the extremity. These may even prevent the condition from recurring.   Longer standing problems, however, will often be relieved faster by:   Using anti-inflammatory agents.   Applying ice packs for 30 minutes at the end of the working day, at bed time, or when activities are finished.   Your caregiver may also have you wear a splint or sling. This will allow the inflamed tendon to heal.  At times, steroid injections aided with a local anesthetic will be required along with splinting for 1 to 2 weeks. Two to three steroid injections will often solve the problem. In some long standing cases, the inflamed tendon does not respond to conservative (non-surgical) therapy. Then surgery may be required to repair it.  MAKE SURE YOU:    Understand these instructions.   Will watch your condition.   Will get help right away if you are not doing well or get worse.  Document Released: 11/15/2005 Document Revised: 02/07/2012 Document Reviewed: 07/03/2008  [employer] Patient Information 2014 [employer].

## 2013-06-07 NOTE — Assessment & Plan Note (Addendum)
I will check his A1C and his renal function today  His A1C is high so I have asked him to restart [place]

## 2013-06-07 NOTE — Assessment & Plan Note (Signed)
His BP is well controlled Today I will check his lytes and renal function 

## 2013-09-03 ENCOUNTER — Other Ambulatory Visit: Payer: Self-pay | Admitting: Internal Medicine

## 2013-10-14 ENCOUNTER — Other Ambulatory Visit: Payer: Self-pay | Admitting: Internal Medicine

## 2013-12-03 ENCOUNTER — Other Ambulatory Visit: Payer: Self-pay | Admitting: Internal Medicine

## 2013-12-04 ENCOUNTER — Telehealth: Payer: Self-pay | Admitting: *Deleted

## 2013-12-04 MED ORDER — TEMAZEPAM 30 MG PO CAPS: 30.0000 mg | ORAL_CAPSULE | Freq: Every evening | ORAL | Status: DC | PRN

## 2013-12-04 NOTE — Telephone Encounter (Signed)
Send the Rx to the pharmacy

## 2013-12-04 NOTE — Telephone Encounter (Signed)
Patient is out of his restoril, has an appointment scheduled with you for Thursday, 12/06/13 and is concerned that abruptly stopping his med might cause side effects.  Please advise.

## 2013-12-05 ENCOUNTER — Other Ambulatory Visit: Payer: Self-pay | Admitting: *Deleted

## 2013-12-05 MED ORDER — TEMAZEPAM 30 MG PO CAPS: 30.0000 mg | ORAL_CAPSULE | Freq: Every evening | ORAL | Status: DC | PRN

## 2013-12-05 NOTE — Telephone Encounter (Signed)
Script printed & awaiting MD signature

## 2013-12-06 ENCOUNTER — Encounter: Payer: Self-pay | Admitting: Internal Medicine

## 2013-12-06 ENCOUNTER — Other Ambulatory Visit (INDEPENDENT_AMBULATORY_CARE_PROVIDER_SITE_OTHER): Payer: No Typology Code available for payment source

## 2013-12-06 ENCOUNTER — Ambulatory Visit (INDEPENDENT_AMBULATORY_CARE_PROVIDER_SITE_OTHER): Payer: No Typology Code available for payment source | Admitting: Internal Medicine

## 2013-12-06 VITALS — BP 132/80 | HR 81 | Temp 97.5°F | Resp 16 | Ht 69.0 in | Wt 232.0 lb

## 2013-12-06 DIAGNOSIS — I10 Essential (primary) hypertension: Secondary | ICD-10-CM

## 2013-12-06 DIAGNOSIS — IMO0001 Reserved for inherently not codable concepts without codable children: Secondary | ICD-10-CM

## 2013-12-06 DIAGNOSIS — E1165 Type 2 diabetes mellitus with hyperglycemia: Secondary | ICD-10-CM

## 2013-12-06 LAB — BASIC METABOLIC PANEL
BUN: 21 mg/dL (ref 6–23)
CO2: 29 mEq/L (ref 19–32)
Calcium: 9.5 mg/dL (ref 8.4–10.5)
Chloride: 102 mEq/L (ref 96–112)
Creatinine, Ser: 1.3 mg/dL (ref 0.4–1.5)
GFR: 59.82 mL/min — ABNORMAL LOW (ref >60.00)
Glucose, Bld: 290 mg/dL — ABNORMAL HIGH (ref 70–99)
Potassium: 4 mEq/L (ref 3.5–5.1)
Sodium: 137 mEq/L (ref 135–145)

## 2013-12-06 LAB — MICROALBUMIN / CREATININE URINE RATIO
Creatinine,U: 105.2 mg/dL
Microalb Creat Ratio: 0.5 mg/g (ref 0.0–30.0)
Microalb, Ur: 0.5 mg/dL (ref 0.0–1.9)

## 2013-12-06 LAB — HEMOGLOBIN A1C: Hgb A1c MFr Bld: 10.1 % — ABNORMAL HIGH (ref 4.6–6.5)

## 2013-12-06 NOTE — Progress Notes (Signed)
Pre visit review using our clinic review tool, if applicable. No additional management support is needed unless otherwise documented below in the visit note. 

## 2013-12-06 NOTE — Patient Instructions (Signed)
Type 2 Diabetes Mellitus, Adult Type 2 diabetes mellitus, often simply referred to as type 2 diabetes, is a long-lasting (chronic) disease. In type 2 diabetes, the pancreas does not make enough insulin (a hormone), the cells are less responsive to the insulin that is made (insulin resistance), or both. Normally, insulin moves sugars from food into the tissue cells. The tissue cells use the sugars for energy. The lack of insulin or the lack of normal response to insulin causes excess sugars to build up in the blood instead of going into the tissue cells. As a result, high blood sugar (hyperglycemia) develops. The effect of high sugar (glucose) levels can cause many complications. Type 2 diabetes was also previously called adult-onset diabetes but it can occur at any age.  RISK FACTORS  A person is predisposed to developing type 2 diabetes if someone in the family has the disease and also has one or more of the following primary risk factors:  Overweight.  An inactive lifestyle.  A history of consistently eating high-calorie foods. Maintaining a normal weight and regular physical activity can reduce the chance of developing type 2 diabetes. SYMPTOMS  A person with type 2 diabetes may not show symptoms initially. The symptoms of type 2 diabetes appear slowly. The symptoms include:  Increased thirst (polydipsia).  Increased urination (polyuria).  Increased urination during the night (nocturia).  Weight loss. This weight loss may be rapid.  Frequent, recurring infections.  Tiredness (fatigue).  Weakness.  Vision changes, such as blurred vision.  Fruity smell to your breath.  Abdominal pain.  Nausea or vomiting.  Cuts or bruises which are slow to heal.  Tingling or numbness in the hands or feet. DIAGNOSIS Type 2 diabetes is frequently not diagnosed until complications of diabetes are present. Type 2 diabetes is diagnosed when symptoms or complications are present and when blood  glucose levels are increased. Your blood glucose level may be checked by one or more of the following blood tests:  A fasting blood glucose test. You will not be allowed to eat for at least 8 hours before a blood sample is taken.  A random blood glucose test. Your blood glucose is checked at any time of the day regardless of when you ate.  A hemoglobin A1c blood glucose test. A hemoglobin A1c test provides information about blood glucose control over the previous 3 months.  An oral glucose tolerance test (OGTT). Your blood glucose is measured after you have not eaten (fasted) for 2 hours and then after you drink a glucose-containing beverage. TREATMENT   You may need to take insulin or diabetes medicine daily to keep blood glucose levels in the desired range.  You will need to match insulin dosing with exercise and healthy food choices. The treatment goal is to maintain the before meal blood sugar (preprandial glucose) level at 70 130 mg/dL. HOME CARE INSTRUCTIONS   Have your hemoglobin A1c level checked twice a year.  Perform daily blood glucose monitoring as directed by your caregiver.  Monitor urine ketones when you are ill and as directed by your caregiver.  Take your diabetes medicine or insulin as directed by your caregiver to maintain your blood glucose levels in the desired range.  Never run out of diabetes medicine or insulin. It is needed every day.  Adjust insulin based on your intake of carbohydrates. Carbohydrates can raise blood glucose levels but need to be included in your diet. Carbohydrates provide vitamins, minerals, and fiber which are an essential part of   a healthy diet. Carbohydrates are found in fruits, vegetables, whole grains, dairy products, legumes, and foods containing added sugars.    Eat healthy foods. Alternate 3 meals with 3 snacks.  Lose weight if overweight.  Carry a medical alert card or wear your medical alert jewelry.  Carry a 15 gram  carbohydrate snack with you at all times to treat low blood glucose (hypoglycemia). Some examples of 15 gram carbohydrate snacks include:  Glucose tablets, 3 or 4   Glucose gel, 15 gram tube  Raisins, 2 tablespoons (24 grams)  Jelly beans, 6  Animal crackers, 8  Regular pop, 4 ounces (120 mL)  Gummy treats, 9  Recognize hypoglycemia. Hypoglycemia occurs with blood glucose levels of 70 mg/dL and below. The risk for hypoglycemia increases when fasting or skipping meals, during or after intense exercise, and during sleep. Hypoglycemia symptoms can include:  Tremors or shakes.  Decreased ability to concentrate.  Sweating.  Increased heart rate.  Headache.  Dry mouth.  Hunger.  Irritability.  Anxiety.  Restless sleep.  Altered speech or coordination.  Confusion.  Treat hypoglycemia promptly. If you are alert and able to safely swallow, follow the 15:15 rule:  Take 15 20 grams of rapid-acting glucose or carbohydrate. Rapid-acting options include glucose gel, glucose tablets, or 4 ounces (120 mL) of fruit juice, regular soda, or low fat milk.  Check your blood glucose level 15 minutes after taking the glucose.  Take 15 20 grams more of glucose if the repeat blood glucose level is still 70 mg/dL or below.  Eat a meal or snack within 1 hour once blood glucose levels return to normal.    Be alert to polyuria and polydipsia which are early signs of hyperglycemia. An early awareness of hyperglycemia allows for prompt treatment. Treat hyperglycemia as directed by your caregiver.  Engage in at least 150 minutes of moderate-intensity physical activity a week, spread over at least 3 days of the week or as directed by your caregiver. In addition, you should engage in resistance exercise at least 2 times a week or as directed by your caregiver.  Adjust your medicine and food intake as needed if you start a new exercise or sport.  Follow your sick day plan at any time you  are unable to eat or drink as usual.  Avoid tobacco use.  Limit alcohol intake to no more than 1 drink per day for nonpregnant women and 2 drinks per day for men. You should drink alcohol only when you are also eating food. Talk with your caregiver whether alcohol is safe for you. Tell your caregiver if you drink alcohol several times a week.  Follow up with your caregiver regularly.  Schedule an eye exam soon after the diagnosis of type 2 diabetes and then annually.  Perform daily skin and foot care. Examine your skin and feet daily for cuts, bruises, redness, nail problems, bleeding, blisters, or sores. A foot exam by a caregiver should be done annually.  Brush your teeth and gums at least twice a day and floss at least once a day. Follow up with your dentist regularly.  Share your diabetes management plan with your workplace or school.  Stay up-to-date with immunizations.  Learn to manage stress.  Obtain ongoing diabetes education and support as needed.  Participate in, or seek rehabilitation as needed to maintain or improve independence and quality of life. Request a physical or occupational therapy referral if you are having foot or hand numbness or difficulties with grooming,   dressing, eating, or physical activity. SEEK MEDICAL CARE IF:   You are unable to eat food or drink fluids for more than 6 hours.  You have nausea and vomiting for more than 6 hours.  Your blood glucose level is over 240 mg/dL.  There is a change in mental status.  You develop an additional serious illness.  You have diarrhea for more than 6 hours.  You have been sick or have had a fever for a couple of days and are not getting better.  You have pain during any physical activity.  SEEK IMMEDIATE MEDICAL CARE IF:  You have difficulty breathing.  You have moderate to large ketone levels. MAKE SURE YOU:  Understand these instructions.  Will watch your condition.  Will get help right away if  you are not doing well or get worse. Document Released: 11/15/2005 Document Revised: 08/09/2012 Document Reviewed: 06/13/2012 [employer] Patient Information 2014 [employer].  

## 2013-12-06 NOTE — Progress Notes (Signed)
Subjective:    Patient ID: Patrick Parker, male    DOB: 1956-08-21, 58 y.o.   MRN: [id number]  Diabetes He presents for his follow-up diabetic visit. He has type 2 diabetes mellitus. His disease course has been fluctuating. There are no hypoglycemic associated symptoms. Pertinent negatives for diabetes include no blurred vision, no chest pain, no fatigue, no foot paresthesias, no foot ulcerations, no polydipsia, no polyuria, no visual change, no weakness and no weight loss. There are no hypoglycemic complications. Symptoms are stable. There are no diabetic complications. Current diabetic treatment includes oral agent (dual therapy). He is compliant with treatment all of the time. His weight is stable. He is following a generally healthy diet. Meal planning includes avoidance of concentrated sweets. He has not had a previous visit with a dietician. He participates in exercise intermittently. His breakfast blood glucose range is generally 180-200 mg/dl. His lunch blood glucose range is generally >200 mg/dl. His dinner blood glucose range is generally >200 mg/dl. His highest blood glucose is >200 mg/dl. His overall blood glucose range is >200 mg/dl. An ACE inhibitor/angiotensin II receptor blocker is being taken. He does not see a podiatrist.Eye exam is current.      Review of Systems  Constitutional: Negative.  Negative for fever, chills, weight loss, diaphoresis, appetite change and fatigue.  HENT: Negative.   Eyes: Negative.  Negative for blurred vision.  Respiratory: Negative.  Negative for cough, choking, chest tightness, shortness of breath, wheezing and stridor.   Cardiovascular: Negative.  Negative for chest pain, palpitations and leg swelling.  Gastrointestinal: Negative.  Negative for nausea, vomiting, abdominal pain, diarrhea, constipation and blood in stool.  Endocrine: Negative.  Negative for polydipsia and polyuria.  Genitourinary: Negative.   Musculoskeletal: Negative.   Skin:  Negative.   Allergic/Immunologic: Negative.   Neurological: Negative.  Negative for weakness.  Hematological: Negative.  Negative for adenopathy. Does not bruise/bleed easily.  Psychiatric/Behavioral: Negative.        Objective:   Physical Exam  Vitals reviewed. Constitutional: He is oriented to person, place, and time. He appears well-developed and well-nourished. No distress.  HENT:  Head: Normocephalic and atraumatic.  Mouth/Throat: Oropharynx is clear and moist. No oropharyngeal exudate.  Eyes: Conjunctivae are normal. Right eye exhibits no discharge. Left eye exhibits no discharge. No scleral icterus.  Neck: Normal range of motion. Neck supple. No JVD present. No tracheal deviation present. No thyromegaly present.  Cardiovascular: Normal rate, regular rhythm, normal heart sounds and intact distal pulses.  Exam reveals no gallop and no friction rub.   No murmur heard. Pulmonary/Chest: Effort normal and breath sounds normal. No stridor. No respiratory distress. He has no wheezes. He has no rales. He exhibits no tenderness.  Abdominal: Soft. Bowel sounds are normal. He exhibits no distension and no mass. There is no tenderness. There is no rebound and no guarding.  Musculoskeletal: Normal range of motion. He exhibits no edema and no tenderness.  Lymphadenopathy:    He has no cervical adenopathy.  Neurological: He is oriented to person, place, and time.  Skin: Skin is warm and dry. No rash noted. He is not diaphoretic. No erythema. No pallor.  Psychiatric: He has a normal mood and affect. His behavior is normal. Judgment and thought content normal.     Lab Results  Component Value Date   WBC 7.9 11/16/2012   HGB 15.7 11/16/2012   HCT 46.1 11/16/2012   PLT 325.0 11/16/2012   GLUCOSE 165* 06/07/2013   CHOL 166 02/05/2013  TRIG 140.0 02/05/2013   HDL 34.10* 02/05/2013   LDLCALC 104* 02/05/2013   ALT 50 11/16/2012   AST 27 11/16/2012   NA 134* 06/07/2013   K 3.6 06/07/2013   CL  102 06/07/2013   CREATININE 1.0 06/07/2013   BUN 15 06/07/2013   CO2 33* 06/07/2013   TSH 2.54 11/16/2012   HGBA1C 7.7* 06/07/2013       Assessment & Plan:

## 2013-12-07 ENCOUNTER — Encounter: Payer: Self-pay | Admitting: Internal Medicine

## 2013-12-07 MED ORDER — INSULIN DETEMIR 100 UNIT/ML FLEXPEN: 30.0000 [IU] | PEN_INJECTOR | Freq: Every day | SUBCUTANEOUS | Status: DC

## 2013-12-07 NOTE — Assessment & Plan Note (Addendum)
His blood sugars are too high - I have asked him to add an insulin with levemir He remain on januvia and merformin

## 2013-12-07 NOTE — Assessment & Plan Note (Signed)
His BP is well controlled His lytes and renal function are normal 

## 2014-01-05 ENCOUNTER — Other Ambulatory Visit: Payer: Self-pay | Admitting: Internal Medicine

## 2014-01-23 ENCOUNTER — Encounter: Payer: Self-pay | Admitting: Internal Medicine

## 2014-01-23 ENCOUNTER — Ambulatory Visit (INDEPENDENT_AMBULATORY_CARE_PROVIDER_SITE_OTHER): Payer: No Typology Code available for payment source | Admitting: Internal Medicine

## 2014-01-23 ENCOUNTER — Other Ambulatory Visit: Payer: No Typology Code available for payment source

## 2014-01-23 VITALS — BP 124/78 | HR 84 | Temp 98.5°F | Resp 16 | Ht 69.0 in | Wt 238.0 lb

## 2014-01-23 DIAGNOSIS — J209 Acute bronchitis, unspecified: Secondary | ICD-10-CM | POA: Insufficient documentation

## 2014-01-23 DIAGNOSIS — M7021 Olecranon bursitis, right elbow: Secondary | ICD-10-CM

## 2014-01-23 DIAGNOSIS — M7020 Olecranon bursitis, unspecified elbow: Secondary | ICD-10-CM

## 2014-01-23 MED ORDER — CEFUROXIME AXETIL 500 MG PO TABS: 500.0000 mg | ORAL_TABLET | Freq: Two times a day (BID) | ORAL | Status: DC

## 2014-01-23 MED ORDER — PROMETHAZINE HCL 12.5 MG PO TABS: 12.5000 mg | ORAL_TABLET | Freq: Four times a day (QID) | ORAL | Status: DC | PRN

## 2014-01-23 MED ORDER — METHYLPREDNISOLONE ACETATE 40 MG/ML IJ SUSP: 40.0000 mg | Freq: Once | INTRAMUSCULAR | Status: DC

## 2014-01-23 NOTE — Assessment & Plan Note (Signed)
Bursa was injected, he will rest/ice/take nsaids I await the culture results

## 2014-01-23 NOTE — Assessment & Plan Note (Signed)
Will treat the infection with ceftin and will control the cough with phenergan

## 2014-01-23 NOTE — Progress Notes (Signed)
Pre visit review using our clinic review tool, if applicable. No additional management support is needed unless otherwise documented below in the visit note. 

## 2014-01-23 NOTE — Progress Notes (Signed)
Subjective:    Patient ID: Patrick Parker, male    DOB: [birth year], 58 y.o.   MRN: [id number]  Cough This is a new problem. The current episode started in the past 7 days. The problem has been gradually worsening. The problem occurs constantly. The cough is productive of purulent sputum. Associated symptoms include chills and a sore throat. Pertinent negatives include no chest pain, ear congestion, ear pain, fever, headaches, heartburn, hemoptysis, myalgias, nasal congestion, postnasal drip, rash, rhinorrhea, shortness of breath, sweats, weight loss or wheezing. He has tried OTC cough suppressant for the symptoms. The treatment provided mild relief. There is no history of asthma, bronchiectasis, bronchitis, COPD, emphysema, environmental allergies or pneumonia.      Review of Systems  Constitutional: Positive for chills. Negative for fever, weight loss, diaphoresis, activity change, appetite change, fatigue and unexpected weight change.  HENT: Positive for sore throat. Negative for ear pain, postnasal drip, rhinorrhea, sinus pressure, sneezing, tinnitus, trouble swallowing and voice change.   Eyes: Negative.   Respiratory: Positive for cough. Negative for apnea, hemoptysis, choking, chest tightness, shortness of breath, wheezing and stridor.   Cardiovascular: Negative.  Negative for chest pain, palpitations and leg swelling.  Gastrointestinal: Negative.  Negative for heartburn, nausea, abdominal pain, diarrhea, constipation and blood in stool.  Endocrine: Negative.  Negative for polydipsia, polyphagia and polyuria.  Genitourinary: Negative.   Musculoskeletal: Positive for arthralgias. Negative for myalgias.       He has had right elbow pain and swelling for one week, he has had some repetitive activity but no specific trauma or injury.   Skin: Negative.  Negative for rash.  Allergic/Immunologic: Negative.  Negative for environmental allergies.  Neurological: Negative.  Negative for headaches.    Hematological: Negative.  Negative for adenopathy. Does not bruise/bleed easily.  Psychiatric/Behavioral: Negative.        Objective:   Physical Exam  Vitals reviewed. Constitutional: He is oriented to person, place, and time. He appears well-developed and well-nourished.  Non-toxic appearance. He does not have a sickly appearance. He does not appear ill. No distress.  HENT:  Head: Normocephalic and atraumatic.  Mouth/Throat: Oropharynx is clear and moist. No oropharyngeal exudate.  Eyes: Conjunctivae are normal. Right eye exhibits no discharge. Left eye exhibits no discharge. No scleral icterus.  Neck: Normal range of motion. Neck supple. No JVD present. No tracheal deviation present. No thyromegaly present.  Cardiovascular: Normal rate, regular rhythm, normal heart sounds and intact distal pulses.  Exam reveals no gallop and no friction rub.   No murmur heard. Pulmonary/Chest: Effort normal and breath sounds normal. No stridor. No respiratory distress. He has no wheezes. He has no rales. He exhibits no tenderness.  Abdominal: Soft. Bowel sounds are normal. He exhibits no distension and no mass. There is no tenderness. There is no rebound and no guarding.  Musculoskeletal: Normal range of motion. He exhibits no edema and no tenderness.       Right elbow: He exhibits swelling and effusion (over the olecranon bursa). He exhibits normal range of motion, no deformity and no laceration. No radial head, no medial epicondyle and no lateral epicondyle tenderness noted.  Rt elbow was cleaned with betadine then prepped and draped. Local anesthesia was obtained with 1 cc of 2% lido with epi. Then, using a 25 gauge 1.5 in needle the bursa was entered and 2 cc of straw-colored fluid was removed and sent for culture. A 3 cc syringe was attached to the same needle and 1 cc of  0.5% plain marcaine and 40 mg of depo-medrol was easily injected into the bursa. He tolerated this well with no complications of blood  loss.  Lymphadenopathy:    He has no cervical adenopathy.  Neurological: He is oriented to person, place, and time.  Skin: Skin is warm and dry. No rash noted. He is not diaphoretic. No erythema. No pallor.     Lab Results  Component Value Date   WBC 7.9 11/16/2012   HGB 15.7 11/16/2012   HCT 46.1 11/16/2012   PLT 325.0 11/16/2012   GLUCOSE 290* 12/06/2013   CHOL 166 02/05/2013   TRIG 140.0 02/05/2013   HDL 34.10* 02/05/2013   LDLCALC 104* 02/05/2013   ALT 50 11/16/2012   AST 27 11/16/2012   NA 137 12/06/2013   K 4.0 12/06/2013   CL 102 12/06/2013   CREATININE 1.3 12/06/2013   BUN 21 12/06/2013   CO2 29 12/06/2013   TSH 2.54 11/16/2012   HGBA1C 10.1* 12/06/2013   MICROALBUR 0.5 12/06/2013       Assessment & Plan:

## 2014-01-23 NOTE — Patient Instructions (Signed)
Olecranon Bursitis Bursitis is swelling and soreness (inflammation) of a fluid-filled sac (bursa) that covers and protects a joint. Olecranon bursitis occurs over the elbow.  CAUSES Bursitis can be caused by injury, overuse of the joint, arthritis, or infection.  SYMPTOMS   Tenderness, swelling, warmth, or redness over the elbow.  Elbow pain with movement. This is greater with bending the elbow.  Squeaking sound when the bursa is rubbed or moved.  Increasing size of the bursa without pain or discomfort.  Fever with increasing pain and swelling if the bursa becomes infected. HOME CARE INSTRUCTIONS   Put ice on the affected area.  Put ice in a plastic bag.  Place a towel between your skin and the bag.  Leave the ice on for 15-20 minutes each hour while awake. Do this for the first 2 days.  When resting, elevate your elbow above the level of your heart. This helps reduce swelling.  Continue to put the joint through a full range of motion 4 times per day. Rest the injured joint at other times. When the pain lessens, begin normal slow movements and usual activities.  Only take over-the-counter or prescription medicines for pain, discomfort, or fever as directed by your caregiver.  Reduce your intake of milk and related dairy products (cheese, yogurt). They may make your condition worse. SEEK IMMEDIATE MEDICAL CARE IF:   Your pain increases even during treatment.  You have a fever.  You have heat and inflammation over the bursa and elbow.  You have a red line that goes up your arm.  You have pain with movement of your elbow. MAKE SURE YOU:   Understand these instructions.  Will watch your condition.  Will get help right away if you are not doing well or get worse. Document Released: 12/15/2006 Document Revised: 02/07/2012 Document Reviewed: 10/31/2007 [employer] Patient Information 2014 [employer].  

## 2014-01-25 LAB — WOUND CULTURE
Gram Stain: NONE SEEN
Gram Stain: NONE SEEN
Gram Stain: NONE SEEN
Organism ID, Bacteria: NO GROWTH

## 2014-01-28 ENCOUNTER — Ambulatory Visit: Payer: No Typology Code available for payment source | Admitting: Dietician

## 2014-02-05 ENCOUNTER — Other Ambulatory Visit: Payer: Self-pay | Admitting: Internal Medicine

## 2014-02-11 ENCOUNTER — Other Ambulatory Visit: Payer: Self-pay | Admitting: Internal Medicine

## 2014-03-02 ENCOUNTER — Other Ambulatory Visit: Payer: Self-pay | Admitting: Internal Medicine

## 2014-03-11 ENCOUNTER — Ambulatory Visit (INDEPENDENT_AMBULATORY_CARE_PROVIDER_SITE_OTHER): Payer: No Typology Code available for payment source | Admitting: Internal Medicine

## 2014-03-11 ENCOUNTER — Encounter: Payer: Self-pay | Admitting: Internal Medicine

## 2014-03-11 ENCOUNTER — Telehealth: Payer: Self-pay | Admitting: Internal Medicine

## 2014-03-11 VITALS — BP 152/98 | HR 75 | Temp 98.6°F | Resp 15 | Wt 235.0 lb

## 2014-03-11 DIAGNOSIS — M7020 Olecranon bursitis, unspecified elbow: Secondary | ICD-10-CM

## 2014-03-11 DIAGNOSIS — M7580 Other shoulder lesions, unspecified shoulder: Secondary | ICD-10-CM

## 2014-03-11 DIAGNOSIS — M7021 Olecranon bursitis, right elbow: Secondary | ICD-10-CM

## 2014-03-11 DIAGNOSIS — M25819 Other specified joint disorders, unspecified shoulder: Secondary | ICD-10-CM

## 2014-03-11 DIAGNOSIS — M7541 Impingement syndrome of right shoulder: Secondary | ICD-10-CM

## 2014-03-11 NOTE — Telephone Encounter (Signed)
Relevant patient education assigned to patient using Emmi. ° °

## 2014-03-11 NOTE — Patient Instructions (Addendum)
Minimal Blood Pressure Goal= AVERAGE < 140/90;  Ideal is an AVERAGE < 135/85. This AVERAGE should be calculated from @ least 5-7 BP readings taken @ different times of day on different days of week. You should not respond to isolated BP readings , but rather the AVERAGE for that week .Please bring your  blood pressure cuff to office visits to verify that it is reliable.It  can also be checked against the blood pressure device at the pharmacy. Finger or wrist cuffs are not dependable; an arm cuff is. The referral with Dr Gardenia Phlegm will be scheduled and you'll be notified of the time. Use an anti-inflammatory cream such as Aspercreme or Zostrix cream twice a day to the affected area as needed. In lieu of this warm moist compresses or  hot water bottle can be used. Do not apply ice .  Once the effusion is controlled; a neoprene sleeve may help prevent reaccumulation.

## 2014-03-11 NOTE — Progress Notes (Signed)
   Subjective:    Patient ID: Patrick Parker, male    DOB: [birth year], 58 y.o.   MRN: [id number]  HPI Has swollen right elbow. Dr. Ronnald Ramp aspirated fluid off of right elbow one month ago and elbow gradually improved until one week ago when it began to swell again. No pain in elbow.   Also has right upper back/shoulder blade pain that started one week ago. It is a stinging, throbbing pain. 10/10 at night and comes and goes during day even at rest, 5/10 on pain scale. Aggravated by lifting for his job. Has taken acetaminophen with little relief. No associated numbness/tingling in right arm.   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 Lacks strength in right arm at times. No fever, sweats, fatigue.     Objective:   Physical Exam  Constitutional: He is oriented to person, place, and time. No distress.  Overweight.  HENT:  Head: Normocephalic and atraumatic.  Eyes: Pupils are equal, round, and reactive to light.  Cardiovascular: Normal rate, regular rhythm and normal heart sounds.   Pulmonary/Chest: Effort normal and breath sounds normal.  Musculoskeletal:  Has good ROM  in right shoulder and elbow. Arm and hand strength good bilaterally. Sensation in arms and hands normal bilaterally. Elevating arm above head causes pain. Right elbow has significant cyst on olecranon process.  Lymphadenopathy:    He has no cervical adenopathy (No axillary lymphadenopathy.).  Neurological: He is alert and oriented to person, place, and time. No cranial nerve deficit.  Skin: Skin is warm and dry. He is not diaphoretic.          Assessment & Plan:  #1 right olecranon bursa #2 repetitive motion muscle strain right shoulder

## 2014-03-11 NOTE — Progress Notes (Signed)
Pre visit review using our clinic review tool, if applicable. No additional management support is needed unless otherwise documented below in the visit note. 

## 2014-03-11 NOTE — Progress Notes (Signed)
   Subjective:    Patient ID: Patrick Parker, male    DOB: [birth year], 58 y.o.   MRN: [id number]  HPI Has swollen right elbow. Dr. Ronnald Ramp aspirated fluid off of right elbow one month ago and elbow gradually improved until one week ago when it began to swell again in context of repetitive motion . 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 No pain in elbow.  Also has right upper back/shoulder blade pain that started one week ago. It is a stinging, throbbing pain. 10/10 at night and comes and goes during day even at rest, 5/10 on pain scale. Also aggravated by lifting for his job. Has taken acetaminophen with little relief. No associated numbness/tingling in right arm.   Does not take HCTZ No history of gout.  Review of Systems  Lacks strength in right arm at times.  No fever, sweats, fatigue.      Objective:   Physical Exam Constitutional: He is oriented to person, place, and time. No distress.  Overweight.  HENT:  Head: Normocephalic and atraumatic.  Eyes: Pupils are equal, round, and reactive to light.  Cardiovascular: Normal rate, regular rhythm and normal heart sounds.  Pulmonary/Chest: Effort normal and breath sounds normal.  Musculoskeletal:  Has good ROM in right shoulder and elbow except slightly decreased elevation due to pain. Arm and hand strength good bilaterally. Sensation in arms and hands normal bilaterally. Elevating arm above head causes pain @ R shoulder. Right elbow has significant edema of olecranon bursa. The bursa transilluminates; it is nontender. There is some tenderness over what appears to be  an olecranon spur on the right  Lymphadenopathy:  He has no cervical adenopathy (No axillary lymphadenopathy.).  Neurological: He is alert and oriented to person, place, and time. No cranial nerve deficit.  Skin: Skin is warm and dry. He is not diaphoretic.            Assessment & Plan:  #1 recurrent olecranon bursal effusion  #2 shoulder impingement  syndrome  Plan: A neoprene sleeve may help control  the fluid long-term. Consultation with Dr. Tamala Julian recommended

## 2014-03-20 ENCOUNTER — Encounter: Payer: Self-pay | Admitting: Internal Medicine

## 2014-03-20 ENCOUNTER — Ambulatory Visit (INDEPENDENT_AMBULATORY_CARE_PROVIDER_SITE_OTHER)
Admission: RE | Admit: 2014-03-20 | Discharge: 2014-03-20 | Disposition: A | Discharge status: Home / Self Care | Payer: No Typology Code available for payment source | Source: Ambulatory Visit | Attending: Internal Medicine | Admitting: Internal Medicine

## 2014-03-20 ENCOUNTER — Other Ambulatory Visit: Payer: No Typology Code available for payment source

## 2014-03-20 ENCOUNTER — Ambulatory Visit (INDEPENDENT_AMBULATORY_CARE_PROVIDER_SITE_OTHER): Payer: No Typology Code available for payment source | Admitting: Internal Medicine

## 2014-03-20 VITALS — BP 146/88 | HR 80 | Temp 98.3°F | Resp 16 | Ht 69.0 in | Wt 237.0 lb

## 2014-03-20 DIAGNOSIS — M7020 Olecranon bursitis, unspecified elbow: Secondary | ICD-10-CM

## 2014-03-20 DIAGNOSIS — M7021 Olecranon bursitis, right elbow: Secondary | ICD-10-CM

## 2014-03-20 MED ORDER — METHYLPREDNISOLONE ACETATE 40 MG/ML IJ SUSP
40.0000 mg | Freq: Once | INTRAMUSCULAR | Status: AC
  Administered 2014-03-20: 40 mg via INTRAMUSCULAR

## 2014-03-20 NOTE — Patient Instructions (Signed)
Olecranon Bursitis Bursitis is swelling and soreness (inflammation) of a fluid-filled sac (bursa) that covers and protects a joint. Olecranon bursitis occurs over the elbow.  CAUSES Bursitis can be caused by injury, overuse of the joint, arthritis, or infection.  SYMPTOMS   Tenderness, swelling, warmth, or redness over the elbow.  Elbow pain with movement. This is greater with bending the elbow.  Squeaking sound when the bursa is rubbed or moved.  Increasing size of the bursa without pain or discomfort.  Fever with increasing pain and swelling if the bursa becomes infected. HOME CARE INSTRUCTIONS   Put ice on the affected area.  Put ice in a plastic bag.  Place a towel between your skin and the bag.  Leave the ice on for 15-20 minutes each hour while awake. Do this for the first 2 days.  When resting, elevate your elbow above the level of your heart. This helps reduce swelling.  Continue to put the joint through a full range of motion 4 times per day. Rest the injured joint at other times. When the pain lessens, begin normal slow movements and usual activities.  Only take over-the-counter or prescription medicines for pain, discomfort, or fever as directed by your caregiver.  Reduce your intake of milk and related dairy products (cheese, yogurt). They may make your condition worse. SEEK IMMEDIATE MEDICAL CARE IF:   Your pain increases even during treatment.  You have a fever.  You have heat and inflammation over the bursa and elbow.  You have a red line that goes up your arm.  You have pain with movement of your elbow. MAKE SURE YOU:   Understand these instructions.  Will watch your condition.  Will get help right away if you are not doing well or get worse. Document Released: 12/15/2006 Document Revised: 02/07/2012 Document Reviewed: 10/31/2007 [employer] Patient Information 2014 [employer].  

## 2014-03-20 NOTE — Progress Notes (Signed)
Pre visit review using our clinic review tool, if applicable. No additional management support is needed unless otherwise documented below in the visit note. 

## 2014-03-21 ENCOUNTER — Encounter: Payer: Self-pay | Admitting: Internal Medicine

## 2014-03-21 NOTE — Assessment & Plan Note (Signed)
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

## 2014-03-21 NOTE — Progress Notes (Signed)
   Subjective:    Patient ID: Patrick Parker, male    DOB: 1956-09-24, 58 y.o.   MRN: [id number]  HPI Comments: He returns complaining of a reoccurrence of pain and swelling over this left elbow with no trauma or injury. I have injected this about 3 weeks ago and it improved some but now it has recurred.     Review of Systems  Constitutional: Negative.  Negative for fever, chills, diaphoresis, appetite change and fatigue.  HENT: Negative.   Eyes: Negative.   Respiratory: Negative.  Negative for cough, choking, chest tightness, shortness of breath and stridor.   Cardiovascular: Negative.  Negative for chest pain, palpitations and leg swelling.  Gastrointestinal: Negative.  Negative for nausea, vomiting, abdominal pain, diarrhea and constipation.  Endocrine: Negative.   Genitourinary: Negative.   Musculoskeletal: Negative.   Skin: Negative.   Allergic/Immunologic: Negative.   Neurological: Negative.   Hematological: Negative.  Negative for adenopathy. Does not bruise/bleed easily.  Psychiatric/Behavioral: Negative.        Objective:   Physical Exam  Musculoskeletal:       Right elbow: He exhibits effusion (there is a large olecranon effusion) and deformity. He exhibits normal range of motion, no swelling and no laceration. Tenderness found. Olecranon process tenderness noted. No radial head, no medial epicondyle and no lateral epicondyle tenderness noted.  Rt elbow was cleaned with betadine then prepped and draped in sterile fashion, local anesthesia was obtained with 1 cc of 2% lido with epi over the postero-lateral area, then a 23 gauge needle was used to enter the bursa and about 12 cc of serosanguinous fluid was easily removed - next 40 mg of depo-medrol was injected into the bursa. He tolerated this well with no blood loss or complications.          Assessment & Plan:

## 2014-03-23 LAB — WOUND CULTURE
Gram Stain: NONE SEEN
Gram Stain: NONE SEEN
Organism ID, Bacteria: NO GROWTH

## 2014-05-30 ENCOUNTER — Telehealth: Payer: Self-pay | Admitting: *Deleted

## 2014-05-30 DIAGNOSIS — IMO0001 Reserved for inherently not codable concepts without codable children: Secondary | ICD-10-CM

## 2014-05-30 DIAGNOSIS — E1165 Type 2 diabetes mellitus with hyperglycemia: Principal | ICD-10-CM

## 2014-05-30 NOTE — Telephone Encounter (Signed)
Patient will come to the lab. Lipid, bmet, a1c ordered

## 2014-08-02 ENCOUNTER — Other Ambulatory Visit: Payer: Self-pay | Admitting: Internal Medicine

## 2014-08-07 ENCOUNTER — Other Ambulatory Visit: Payer: Self-pay | Admitting: Internal Medicine

## 2014-08-13 ENCOUNTER — Other Ambulatory Visit (INDEPENDENT_AMBULATORY_CARE_PROVIDER_SITE_OTHER): Payer: No Typology Code available for payment source

## 2014-08-13 ENCOUNTER — Encounter: Payer: Self-pay | Admitting: Internal Medicine

## 2014-08-13 ENCOUNTER — Ambulatory Visit (INDEPENDENT_AMBULATORY_CARE_PROVIDER_SITE_OTHER): Payer: No Typology Code available for payment source | Admitting: Internal Medicine

## 2014-08-13 VITALS — BP 142/88 | HR 67 | Temp 97.5°F | Resp 16 | Ht 69.0 in | Wt 226.0 lb

## 2014-08-13 DIAGNOSIS — E1165 Type 2 diabetes mellitus with hyperglycemia: Principal | ICD-10-CM

## 2014-08-13 DIAGNOSIS — I10 Essential (primary) hypertension: Secondary | ICD-10-CM

## 2014-08-13 DIAGNOSIS — E785 Hyperlipidemia, unspecified: Secondary | ICD-10-CM

## 2014-08-13 DIAGNOSIS — IMO0001 Reserved for inherently not codable concepts without codable children: Secondary | ICD-10-CM

## 2014-08-13 DIAGNOSIS — G4700 Insomnia, unspecified: Secondary | ICD-10-CM

## 2014-08-13 LAB — COMPREHENSIVE METABOLIC PANEL
ALT: 32 U/L (ref 0–53)
AST: 21 U/L (ref 0–37)
Albumin: 4.1 g/dL (ref 3.5–5.2)
Alkaline Phosphatase: 72 U/L (ref 39–117)
BUN: 14 mg/dL (ref 6–23)
CO2: 28 mEq/L (ref 19–32)
Calcium: 9.5 mg/dL (ref 8.4–10.5)
Chloride: 104 mEq/L (ref 96–112)
Creatinine, Ser: 1 mg/dL (ref 0.4–1.5)
GFR: 85.43 mL/min (ref >60.00)
Glucose, Bld: 155 mg/dL — ABNORMAL HIGH (ref 70–99)
Potassium: 4.1 mEq/L (ref 3.5–5.1)
Sodium: 139 mEq/L (ref 135–145)
Total Bilirubin: 0.4 mg/dL (ref 0.2–1.2)
Total Protein: 7.4 g/dL (ref 6.0–8.3)

## 2014-08-13 LAB — LIPID PANEL
Cholesterol: 153 mg/dL (ref 0–200)
HDL: 34.3 mg/dL — ABNORMAL LOW (ref >39.00)
LDL Cholesterol: 98 mg/dL (ref 0–99)
NonHDL: 118.7
Total CHOL/HDL Ratio: 4
Triglycerides: 103 mg/dL (ref 0.0–149.0)
VLDL: 20.6 mg/dL (ref 0.0–40.0)

## 2014-08-13 LAB — TSH: TSH: 1.57 u[IU]/mL (ref 0.35–4.50)

## 2014-08-13 LAB — HEMOGLOBIN A1C: Hgb A1c MFr Bld: 7.8 % — ABNORMAL HIGH (ref 4.6–6.5)

## 2014-08-13 MED ORDER — TRAZODONE HCL 100 MG PO TABS: 100.0000 mg | ORAL_TABLET | Freq: Every day | ORAL | Status: DC

## 2014-08-13 MED ORDER — TEMAZEPAM 30 MG PO CAPS: 30.0000 mg | ORAL_CAPSULE | Freq: Every evening | ORAL | Status: DC | PRN

## 2014-08-13 NOTE — Progress Notes (Signed)
Subjective:    Patient ID: Patrick Parker, male    DOB: Oct 20, 1956, 58 y.o.   MRN: [id number]  Diabetes He presents for his follow-up diabetic visit. He has type 2 diabetes mellitus. His disease course has been stable. There are no hypoglycemic associated symptoms. Pertinent negatives for hypoglycemia include no confusion, dizziness, headaches, nervousness/anxiousness or speech difficulty. Pertinent negatives for diabetes include no chest pain, no fatigue, no foot paresthesias, no foot ulcerations, no polydipsia, no polyphagia, no polyuria, no visual change, no weakness and no weight loss. There are no hypoglycemic complications. Diabetic complications include nephropathy. Current diabetic treatment includes oral agent (dual therapy). He is compliant with treatment most of the time. His weight is decreasing steadily. He is following a generally healthy diet. Meal planning includes avoidance of concentrated sweets. He participates in exercise three times a week. There is no change in his home blood glucose trend. An ACE inhibitor/angiotensin II receptor blocker is being taken. He does not see a podiatrist.Eye exam is current.      Review of Systems  Constitutional: Negative.  Negative for fever, chills, weight loss, diaphoresis, appetite change and fatigue.  HENT: Negative.   Eyes: Negative.   Respiratory: Positive for apnea. Negative for cough, choking, chest tightness, shortness of breath, wheezing and stridor.   Cardiovascular: Negative.  Negative for chest pain, palpitations and leg swelling.  Gastrointestinal: Negative.  Negative for nausea, vomiting, abdominal pain, diarrhea, constipation, blood in stool and rectal pain.  Endocrine: Negative.  Negative for polydipsia, polyphagia and polyuria.  Genitourinary: Negative.   Musculoskeletal: Negative.  Negative for arthralgias, back pain, myalgias and neck pain.  Skin: Negative.  Negative for rash.  Allergic/Immunologic: Negative.     Neurological: Negative.  Negative for dizziness, syncope, speech difficulty, weakness, light-headedness, numbness and headaches.  Hematological: Negative.  Negative for adenopathy. Does not bruise/bleed easily.  Psychiatric/Behavioral: Positive for sleep disturbance. Negative for suicidal ideas, hallucinations, behavioral problems, confusion, self-injury, dysphoric mood, decreased concentration and agitation. The patient is not nervous/anxious and is not hyperactive.        He complains of persistent insomnia with DFA and FA's, he tells me that temazepam helps some but he wants something else to help with this       Objective:   Physical Exam  Vitals reviewed. Constitutional: He is oriented to person, place, and time. He appears well-developed and well-nourished. No distress.  HENT:  Head: Normocephalic and atraumatic.  Mouth/Throat: Oropharynx is clear and moist. No oropharyngeal exudate.  Eyes: Conjunctivae are normal. Right eye exhibits no discharge. Left eye exhibits no discharge. No scleral icterus.  Neck: Normal range of motion. Neck supple. No JVD present. No tracheal deviation present. No thyromegaly present.  Cardiovascular: Normal rate, regular rhythm, normal heart sounds and intact distal pulses.  Exam reveals no gallop and no friction rub.   No murmur heard. Pulmonary/Chest: Effort normal and breath sounds normal. No stridor. No respiratory distress. He has no wheezes. He has no rales. He exhibits no tenderness.  Abdominal: Soft. Bowel sounds are normal. He exhibits no distension and no mass. There is no tenderness. There is no rebound and no guarding.  Musculoskeletal: Normal range of motion. He exhibits no edema and no tenderness.  Lymphadenopathy:    He has no cervical adenopathy.  Neurological: He is oriented to person, place, and time.  Skin: Skin is warm and dry. No rash noted. He is not diaphoretic. No erythema. No pallor.  Psychiatric: He has a normal mood and affect.  His behavior is normal. Judgment and thought content normal. His mood appears not anxious. His affect is not angry, not blunt, not labile and not inappropriate. His speech is not rapid and/or pressured, not tangential and not slurred. Cognition and memory are normal. He does not exhibit a depressed mood. He expresses no homicidal and no suicidal ideation. He expresses no suicidal plans and no homicidal plans. He is communicative.          Assessment & Plan:

## 2014-08-13 NOTE — Patient Instructions (Signed)

## 2014-08-13 NOTE — Progress Notes (Signed)
Pre visit review using our clinic review tool, if applicable. No additional management support is needed unless otherwise documented below in the visit note. 

## 2014-08-14 ENCOUNTER — Encounter: Payer: Self-pay | Admitting: Internal Medicine

## 2014-08-14 LAB — CBC WITH DIFFERENTIAL/PLATELET
Basophils Absolute: 0 10*3/uL (ref 0.0–0.1)
Basophils Relative: 0.5 % (ref 0.0–3.0)
Eosinophils Absolute: 0.2 10*3/uL (ref 0.0–0.7)
Eosinophils Relative: 3.6 % (ref 0.0–5.0)
HCT: 48.3 % (ref 39.0–52.0)
Hemoglobin: 16.3 g/dL (ref 13.0–17.0)
Lymphocytes Relative: 23.8 % (ref 12.0–46.0)
Lymphs Abs: 1.6 10*3/uL (ref 0.7–4.0)
MCHC: 33.8 g/dL (ref 30.0–36.0)
MCV: 92.8 fl (ref 78.0–100.0)
Monocytes Absolute: 0.6 10*3/uL (ref 0.1–1.0)
Monocytes Relative: 8.8 % (ref 3.0–12.0)
Neutro Abs: 4.2 10*3/uL (ref 1.4–7.7)
Neutrophils Relative %: 63.3 % (ref 43.0–77.0)
Platelets: 321 10*3/uL (ref 150.0–400.0)
RBC: 5.2 Mil/uL (ref 4.22–5.81)
RDW: 13.4 % (ref 11.5–15.5)
WBC: 6.7 10*3/uL (ref 4.0–10.5)

## 2014-08-14 NOTE — Assessment & Plan Note (Signed)
Will add trazodone to temazepam to help with this

## 2014-08-14 NOTE — Assessment & Plan Note (Signed)
His BP is adequately well controlled I will monitor his lytes and renal function today

## 2014-08-14 NOTE — Assessment & Plan Note (Signed)
I will recheck his A1C and will advise further Will also monitor his renal function He will cont the current regimen

## 2014-11-07 ENCOUNTER — Other Ambulatory Visit: Payer: Self-pay | Admitting: Internal Medicine

## 2014-11-25 ENCOUNTER — Other Ambulatory Visit: Payer: Self-pay | Admitting: Internal Medicine

## 2015-01-22 ENCOUNTER — Telehealth: Payer: Self-pay | Admitting: Internal Medicine

## 2015-01-22 DIAGNOSIS — E118 Type 2 diabetes mellitus with unspecified complications: Secondary | ICD-10-CM

## 2015-01-22 MED ORDER — METFORMIN HCL 1000 MG PO TABS: 1000.0000 mg | ORAL_TABLET | Freq: Two times a day (BID) | ORAL | Status: DC

## 2015-01-22 NOTE — Telephone Encounter (Signed)
Start metformin Please follow up soon

## 2015-01-22 NOTE — Telephone Encounter (Signed)
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  He has been trying to get it down, eating really well.  He has lost another 14 pounds but cant seem to get sugar down.  He also wanted to know if something else could be called in beside what was called in last time.  (didnt know name) . It was $450.00 and couldn't afford it.  He request to sent to pharmacy on file if it could be called and wife is going to fedex it to him.

## 2015-01-23 MED ORDER — INSULIN GLARGINE 100 UNIT/ML SOLOSTAR PEN: 30.0000 [IU] | PEN_INJECTOR | Freq: Every day | SUBCUTANEOUS | Status: DC

## 2015-01-23 MED ORDER — "PEN NEEDLES 5/16"" 30G X 8 MM MISC": Status: DC

## 2015-01-23 NOTE — Telephone Encounter (Signed)
Pt wife informed that rx is done.

## 2015-01-23 NOTE — Telephone Encounter (Signed)
Pt returned call.     Sent meds to  [facility] [phone]

## 2015-01-23 NOTE — Telephone Encounter (Signed)
Can't go any higher on the metformin dose Will try once daily insulin

## 2015-01-23 NOTE — Telephone Encounter (Signed)
LVM for pt to call back.   Called pt wife and she stated that the pt has the rx for metformin but is looking for something in addition to the metformin to bring his sugar down. He is currently taking 1 tab bid. They are wondering if they can go up to 2 tabs bid or to additional rx for blood sugar control

## 2015-01-23 NOTE — Telephone Encounter (Signed)
LVM with Shelia regarding PCP advise, rx and instruction for dose.   Requested Shelia to call me back if there are any questions.

## 2015-02-08 ENCOUNTER — Other Ambulatory Visit: Payer: Self-pay | Admitting: Internal Medicine

## 2015-03-07 ENCOUNTER — Other Ambulatory Visit: Payer: Self-pay | Admitting: Internal Medicine

## 2015-04-03 ENCOUNTER — Other Ambulatory Visit: Payer: Self-pay | Admitting: Internal Medicine

## 2015-06-04 ENCOUNTER — Other Ambulatory Visit: Payer: Self-pay | Admitting: Internal Medicine

## 2015-06-09 ENCOUNTER — Other Ambulatory Visit: Payer: Self-pay | Admitting: Internal Medicine

## 2015-06-11 ENCOUNTER — Telehealth: Payer: Self-pay | Admitting: Internal Medicine

## 2015-06-11 NOTE — Telephone Encounter (Signed)
Patient is requesting refills on temazepam and benazepril to be sent to [facility] PH# [phone]

## 2015-06-13 ENCOUNTER — Other Ambulatory Visit: Payer: Self-pay | Admitting: Internal Medicine

## 2015-06-13 NOTE — Telephone Encounter (Signed)
Patient has called back regarding the refill request. I scheduled him an appointment of Thursday. Can we fill these to get him till then. Temazepam and Benazepril. Pharmacy [address] at [facility]

## 2015-06-19 ENCOUNTER — Other Ambulatory Visit (INDEPENDENT_AMBULATORY_CARE_PROVIDER_SITE_OTHER): Payer: No Typology Code available for payment source

## 2015-06-19 ENCOUNTER — Encounter: Payer: Self-pay | Admitting: Internal Medicine

## 2015-06-19 ENCOUNTER — Ambulatory Visit (INDEPENDENT_AMBULATORY_CARE_PROVIDER_SITE_OTHER): Payer: No Typology Code available for payment source | Admitting: Internal Medicine

## 2015-06-19 VITALS — BP 120/70 | HR 73 | Temp 98.3°F | Resp 16 | Ht 69.0 in | Wt 213.0 lb

## 2015-06-19 DIAGNOSIS — E118 Type 2 diabetes mellitus with unspecified complications: Secondary | ICD-10-CM | POA: Diagnosis not present

## 2015-06-19 DIAGNOSIS — G4700 Insomnia, unspecified: Secondary | ICD-10-CM | POA: Diagnosis not present

## 2015-06-19 DIAGNOSIS — I10 Essential (primary) hypertension: Secondary | ICD-10-CM | POA: Diagnosis not present

## 2015-06-19 LAB — LIPID PANEL
Cholesterol: 163 mg/dL (ref 0–200)
HDL: 38 mg/dL — ABNORMAL LOW (ref >39.00)
NonHDL: 125
Total CHOL/HDL Ratio: 4
Triglycerides: 296 mg/dL — ABNORMAL HIGH (ref 0.0–149.0)
VLDL: 59.2 mg/dL — ABNORMAL HIGH (ref 0.0–40.0)

## 2015-06-19 LAB — CBC WITH DIFFERENTIAL/PLATELET
Basophils Absolute: 0 10*3/uL (ref 0.0–0.1)
Basophils Relative: 0.4 % (ref 0.0–3.0)
Eosinophils Absolute: 0.2 10*3/uL (ref 0.0–0.7)
Eosinophils Relative: 2.4 % (ref 0.0–5.0)
HCT: 46.5 % (ref 39.0–52.0)
Hemoglobin: 15.7 g/dL (ref 13.0–17.0)
Lymphocytes Relative: 22.3 % (ref 12.0–46.0)
Lymphs Abs: 1.9 10*3/uL (ref 0.7–4.0)
MCHC: 33.8 g/dL (ref 30.0–36.0)
MCV: 92.8 fl (ref 78.0–100.0)
Monocytes Absolute: 0.6 10*3/uL (ref 0.1–1.0)
Monocytes Relative: 6.9 % (ref 3.0–12.0)
Neutro Abs: 5.7 10*3/uL (ref 1.4–7.7)
Neutrophils Relative %: 68 % (ref 43.0–77.0)
Platelets: 284 10*3/uL (ref 150.0–400.0)
RBC: 5.01 Mil/uL (ref 4.22–5.81)
RDW: 12.9 % (ref 11.5–15.5)
WBC: 8.4 10*3/uL (ref 4.0–10.5)

## 2015-06-19 LAB — URINALYSIS, ROUTINE W REFLEX MICROSCOPIC
Bilirubin Urine: NEGATIVE
Hgb urine dipstick: NEGATIVE
Ketones, ur: NEGATIVE
Leukocytes, UA: NEGATIVE
Nitrite: NEGATIVE
RBC / HPF: NONE SEEN (ref 0–?)
Specific Gravity, Urine: 1.015 (ref 1.000–1.030)
Total Protein, Urine: NEGATIVE
Urine Glucose: >=1000 — AB
Urobilinogen, UA: 0.2 (ref 0.0–1.0)
WBC, UA: NONE SEEN (ref 0–?)
pH: 6 (ref 5.0–8.0)

## 2015-06-19 LAB — BASIC METABOLIC PANEL
BUN: 19 mg/dL (ref 6–23)
CO2: 29 mEq/L (ref 19–32)
Calcium: 9.7 mg/dL (ref 8.4–10.5)
Chloride: 101 mEq/L (ref 96–112)
Creatinine, Ser: 1.04 mg/dL (ref 0.40–1.50)
GFR: 77.67 mL/min (ref >60.00)
Glucose, Bld: 279 mg/dL — ABNORMAL HIGH (ref 70–99)
Potassium: 4.1 mEq/L (ref 3.5–5.1)
Sodium: 135 mEq/L (ref 135–145)

## 2015-06-19 LAB — LDL CHOLESTEROL, DIRECT: Direct LDL: 97 mg/dL

## 2015-06-19 LAB — MICROALBUMIN / CREATININE URINE RATIO
Creatinine,U: 112.8 mg/dL
Microalb Creat Ratio: 0.6 mg/g (ref 0.0–30.0)
Microalb, Ur: 0.7 mg/dL (ref 0.0–1.9)

## 2015-06-19 LAB — HEMOGLOBIN A1C: Hgb A1c MFr Bld: 9.8 % — ABNORMAL HIGH (ref 4.6–6.5)

## 2015-06-19 MED ORDER — BENAZEPRIL HCL 20 MG PO TABS: 20.0000 mg | ORAL_TABLET | Freq: Every day | ORAL | Status: DC

## 2015-06-19 MED ORDER — TEMAZEPAM 30 MG PO CAPS: ORAL_CAPSULE | ORAL | Status: DC

## 2015-06-19 NOTE — Patient Instructions (Signed)

## 2015-06-19 NOTE — Progress Notes (Signed)
Pre visit review using our clinic review tool, if applicable. No additional management support is needed unless otherwise documented below in the visit note. 

## 2015-06-19 NOTE — Progress Notes (Signed)
Subjective:  Patient ID: Patrick Parker, male    DOB: [birth year]  Age: 59 y.o. MRN: [id number]  CC: Diabetes    HPI [name] presents for a follow-up on diabetes. He tells me that his blood sugars are well controlled. He has decided not to use the insulin that was recommended during his last visit or to take Januvia. He exercises frequently and has no chest pain, dyspnea on exertion, or shortness of breath.  Outpatient Prescriptions Prior to Visit  Medication Sig Dispense Refill  . amLODipine (NORVASC) 10 MG tablet TAKE 1 TABLET BY MOUTH DAILY 90 tablet 1  . aspirin 325 MG tablet Take 325 mg by mouth daily.      Marland Kitchen atenolol (TENORMIN) 50 MG tablet TAKE 1 TABLET BY MOUTH EVERY DAY 90 tablet 2  . CINNAMON PO Take 1,000 mg by mouth daily.     . metFORMIN (GLUCOPHAGE) 1000 MG tablet Take 1 tablet (1,000 mg total) by mouth 2 (two) times daily with a meal. 180 tablet 1  . Multiple Vitamin (MULTIVITAMIN) tablet Take 1 tablet by mouth daily.    . Saw Palmetto 450 MG CAPS Take by mouth. Takes one tablet by mouth as directed     . simvastatin (ZOCOR) 20 MG tablet TAKE 1 TABLET BY MOUTH EVERY EVENING 90 tablet 2  . benazepril (LOTENSIN) 20 MG tablet TAKE 1 TABLET BY MOUTH DAILY 90 tablet 1  . temazepam (RESTORIL) 30 MG capsule TAKE 1 CAPSULE BY MOUTH AT BEDTIME AS NEEDED FOR SLEEP 30 capsule 3  . Insulin Glargine (LANTUS SOLOSTAR) 100 UNIT/ML Solostar Pen Inject 30 Units into the skin daily at 10 pm. (Patient not taking: Reported on 06/19/2015) 15 mL 0  . Insulin Pen Needle (PEN NEEDLES 5/16") 30G X 8 MM MISC Use pen needles to inject insulin as directed by physician. (Patient not taking: Reported on 06/19/2015) 45 each 0  . sitaGLIPtin (JANUVIA) 100 MG tablet Take 1 tablet (100 mg total) by mouth daily. (Patient not taking: Reported on 06/19/2015) 90 tablet 3  . traZODone (DESYREL) 100 MG tablet Take 1 tablet (100 mg total) by mouth at bedtime. (Patient not taking: Reported on 06/19/2015) 90 tablet 1    No facility-administered medications prior to visit.    ROS Review of Systems  Constitutional: Negative.  Negative for fever, diaphoresis, appetite change and fatigue.  HENT: Negative.   Eyes: Negative.   Respiratory: Negative.  Negative for cough, choking, chest tightness, shortness of breath and stridor.   Cardiovascular: Negative.  Negative for chest pain, palpitations and leg swelling.  Gastrointestinal: Negative.  Negative for nausea, abdominal pain, diarrhea, constipation and blood in stool.  Endocrine: Negative.  Negative for polydipsia, polyphagia and polyuria.  Genitourinary: Negative.   Musculoskeletal: Negative.  Negative for myalgias and arthralgias.  Skin: Negative.  Negative for rash.  Allergic/Immunologic: Negative.   Neurological: Negative.   Hematological: Negative.  Negative for adenopathy. Does not bruise/bleed easily.  Psychiatric/Behavioral: Positive for sleep disturbance. Negative for suicidal ideas, behavioral problems, dysphoric mood and decreased concentration. The patient is not nervous/anxious.     Objective:  BP 120/70 mmHg  Pulse 73  Temp(Src) 98.3 F (36.8 C) (Oral)  Resp 16  Ht 5\' 9"  (1.753 m)  Wt 213 lb (96.616 kg)  BMI 31.44 kg/m2  SpO2 97%  BP Readings from Last 3 Encounters:  06/19/15 120/70  08/13/14 142/88  03/20/14 146/88    Wt Readings from Last 3 Encounters:  06/19/15 213 lb (96.616 kg)  08/13/14  226 lb (102.513 kg)  03/20/14 237 lb (107.502 kg)    Physical Exam  Constitutional: He is oriented to person, place, and time. No distress.  HENT:  Mouth/Throat: Oropharynx is clear and moist. No oropharyngeal exudate.  Eyes: Conjunctivae are normal. Right eye exhibits no discharge. Left eye exhibits no discharge. No scleral icterus.  Neck: Normal range of motion. Neck supple. No JVD present. No tracheal deviation present. No thyromegaly present.  Cardiovascular: Normal rate, regular rhythm, normal heart sounds and intact distal  pulses.  Exam reveals no gallop and no friction rub.   No murmur heard. Pulmonary/Chest: Effort normal and breath sounds normal. No stridor. No respiratory distress. He has no wheezes. He has no rales. He exhibits no tenderness.  Abdominal: Soft. He exhibits no distension and no mass. There is no tenderness. There is no rebound and no guarding.  Musculoskeletal: Normal range of motion. He exhibits no edema or tenderness.  Lymphadenopathy:    He has no cervical adenopathy.  Neurological: He is oriented to person, place, and time.  Skin: Skin is warm and dry. No rash noted. He is not diaphoretic. No erythema. No pallor.  Vitals reviewed.   Lab Results  Component Value Date   WBC 8.4 06/19/2015   HGB 15.7 06/19/2015   HCT 46.5 06/19/2015   PLT 284.0 06/19/2015   GLUCOSE 279* 06/19/2015   CHOL 163 06/19/2015   TRIG 296.0* 06/19/2015   HDL 38.00* 06/19/2015   LDLDIRECT 97.0 06/19/2015   LDLCALC 98 08/13/2014   ALT 32 08/13/2014   AST 21 08/13/2014   NA 135 06/19/2015   K 4.1 06/19/2015   CL 101 06/19/2015   CREATININE 1.04 06/19/2015   BUN 19 06/19/2015   CO2 29 06/19/2015   TSH 1.57 08/13/2014   HGBA1C 9.8* 06/19/2015   MICROALBUR 0.7 06/19/2015    Dg Elbow Complete Right  03/20/2014   CLINICAL DATA:  Pain and swelling  EXAM: RIGHT ELBOW - COMPLETE 3+ VIEW  COMPARISON:  None.  FINDINGS: Four views of the right elbow submitted. No acute fracture or subluxation. No posterior fat pad sign. There is soft tissue swelling olecranon region suspicious for olecranon bursitis.  IMPRESSION: No acute fracture or subluxation. Soft tissue swelling olecranon region suspicious for bursitis or cellulitis.   Electronically Signed   By: Lahoma Crocker M.D.   On: 03/20/2014 17:01    Assessment & Plan:   [name] was seen today for diabetes.  Diagnoses and all orders for this visit:  Type II diabetes mellitus with manifestations- his blood sugars are not well-controlled. He is not compliant with the  recommendations that I have made. Will refer him to endocrinology for further evaluation and treatment. He will continue to work on his lifestyle modifications. Orders: -     Lipid panel; Future -     Basic metabolic panel; Future -     Hemoglobin A1c; Future -     Microalbumin / creatinine urine ratio; Future -     benazepril (LOTENSIN) 20 MG tablet; Take 1 tablet (20 mg total) by mouth daily. -     Ambulatory referral to Endocrinology  Essential hypertension- his blood pressure is well-controlled, lites and renal function are stable. Orders: -     Basic metabolic panel; Future -     CBC with Differential/Platelet; Future -     Urinalysis, Routine w reflex microscopic (not at [facility]); Future -     benazepril (LOTENSIN) 20 MG tablet; Take 1 tablet (20 mg total)  by mouth daily.  Insomnia Orders: -     temazepam (RESTORIL) 30 MG capsule; TAKE 1 CAPSULE BY MOUTH AT BEDTIME AS NEEDED FOR SLEEP   I have discontinued Mr. Cure sitaGLIPtin, traZODone, Insulin Glargine, and Pen Needles 5/16". I have also changed his benazepril. Additionally, I am having him maintain his aspirin, Saw Palmetto, multivitamin, CINNAMON PO, metFORMIN, atenolol, simvastatin, amLODipine, and temazepam.  Meds ordered this encounter  Medications  . temazepam (RESTORIL) 30 MG capsule    Sig: TAKE 1 CAPSULE BY MOUTH AT BEDTIME AS NEEDED FOR SLEEP    Dispense:  30 capsule    Refill:  5  . benazepril (LOTENSIN) 20 MG tablet    Sig: Take 1 tablet (20 mg total) by mouth daily.    Dispense:  90 tablet    Refill:  1     Follow-up: Return in about 4 months (around 10/20/2015).  Scarlette Calico, MD

## 2015-07-29 ENCOUNTER — Ambulatory Visit: Payer: No Typology Code available for payment source | Admitting: Endocrinology

## 2015-09-30 ENCOUNTER — Ambulatory Visit (INDEPENDENT_AMBULATORY_CARE_PROVIDER_SITE_OTHER)
Admission: RE | Admit: 2015-09-30 | Discharge: 2015-09-30 | Disposition: A | Discharge status: Home / Self Care | Payer: No Typology Code available for payment source | Source: Ambulatory Visit | Attending: Internal Medicine | Admitting: Internal Medicine

## 2015-09-30 ENCOUNTER — Encounter: Payer: Self-pay | Admitting: Internal Medicine

## 2015-09-30 ENCOUNTER — Ambulatory Visit (INDEPENDENT_AMBULATORY_CARE_PROVIDER_SITE_OTHER): Payer: No Typology Code available for payment source | Admitting: Internal Medicine

## 2015-09-30 VITALS — BP 118/64 | HR 71 | Temp 98.3°F | Resp 16 | Ht 69.0 in | Wt 222.0 lb

## 2015-09-30 DIAGNOSIS — M5412 Radiculopathy, cervical region: Secondary | ICD-10-CM

## 2015-09-30 DIAGNOSIS — Z23 Encounter for immunization: Secondary | ICD-10-CM

## 2015-09-30 DIAGNOSIS — I10 Essential (primary) hypertension: Secondary | ICD-10-CM

## 2015-09-30 DIAGNOSIS — E118 Type 2 diabetes mellitus with unspecified complications: Secondary | ICD-10-CM

## 2015-09-30 DIAGNOSIS — Z794 Long term (current) use of insulin: Secondary | ICD-10-CM

## 2015-09-30 MED ORDER — MELOXICAM 15 MG PO TABS
15.0000 mg | ORAL_TABLET | Freq: Every day | ORAL | Status: DC
Start: 2015-09-30 — End: 2016-12-20

## 2015-09-30 NOTE — Progress Notes (Signed)
Pre visit review using our clinic review tool, if applicable. No additional management support is needed unless otherwise documented below in the visit note. 

## 2015-09-30 NOTE — Patient Instructions (Signed)
Acute Torticollis °Torticollis is a condition in which the muscles of the neck tighten (contract) abnormally, causing the neck to twist and the head to move into an unnatural position. Torticollis that develops suddenly is called acute torticollis. If torticollis becomes chronic and is left untreated, the face and neck can become deformed. °CAUSES °This condition may be caused by: °· Sleeping in an awkward position (common). °· Extending or twisting the neck muscles beyond their normal position. °· Infection. °In some cases, the cause may not be known. °SYMPTOMS °Symptoms of this condition include: °· An unnatural position of the head. °· Neck pain. °· A limited ability to move the neck. °· Twisting of the neck to one side. °DIAGNOSIS °This condition is diagnosed with a physical exam. You may also have imaging tests, such as an X-ray, CT scan, or MRI. °TREATMENT °Treatment for this condition involves trying to relax the neck muscles. It may include: °· Medicines or shots. °· Physical therapy. °· Surgery. This may be done in severe cases. °HOME CARE INSTRUCTIONS °· Take medicines only as directed by your health care provider. °· Do stretching exercises and massage your neck as directed by your health care provider. °· Keep all follow-up visits as directed by your health care provider. This is important. °SEEK MEDICAL CARE IF: °· You develop a fever. °SEEK IMMEDIATE MEDICAL CARE IF: °· You develop difficulty breathing. °· You develop noisy breathing (stridor). °· You start drooling. °· You have trouble swallowing or have pain with swallowing. °· You develop numbness or weakness in your hands or feet. °· You have changes in your speech, understanding, or vision. °· Your pain gets worse. °  °This information is not intended to replace advice given to you by your health care provider. Make sure you discuss any questions you have with your health care provider. °  °Document Released: 11/12/2000 Document Revised:  04/01/2015 Document Reviewed: 11/11/2014 °Elsevier Interactive Patient Education ©2016 [employer] ° °

## 2015-09-30 NOTE — Progress Notes (Signed)
Subjective:  Patient ID: Patrick Parker, male    DOB: [birth year]  Age: 59 y.o. MRN: [id number]  CC: Neck Pain   HPI Patrick Parker presents for worsening right side neck pain (burning and soreness) over the last 6 weeks with no preceeding trauma or injury. He he has tried to control the pain with tylenol with only minimal relief. He also reports numbness and weakness in his right hand.  Outpatient Prescriptions Prior to Visit  Medication Sig Dispense Refill  . amLODipine (NORVASC) 10 MG tablet TAKE 1 TABLET BY MOUTH DAILY 90 tablet 1  . aspirin 325 MG tablet Take 325 mg by mouth daily.      Marland Kitchen atenolol (TENORMIN) 50 MG tablet TAKE 1 TABLET BY MOUTH EVERY DAY 90 tablet 2  . benazepril (LOTENSIN) 20 MG tablet Take 1 tablet (20 mg total) by mouth daily. 90 tablet 1  . CINNAMON PO Take 1,000 mg by mouth daily.     . metFORMIN (GLUCOPHAGE) 1000 MG tablet Take 1 tablet (1,000 mg total) by mouth 2 (two) times daily with a meal. 180 tablet 1  . Multiple Vitamin (MULTIVITAMIN) tablet Take 1 tablet by mouth daily.    . Saw Palmetto 450 MG CAPS Take by mouth. Takes one tablet by mouth as directed     . simvastatin (ZOCOR) 20 MG tablet TAKE 1 TABLET BY MOUTH EVERY EVENING 90 tablet 2  . temazepam (RESTORIL) 30 MG capsule TAKE 1 CAPSULE BY MOUTH AT BEDTIME AS NEEDED FOR SLEEP 30 capsule 5   No facility-administered medications prior to visit.    ROS Review of Systems  Constitutional: Negative for fever, chills, diaphoresis, appetite change and fatigue.  HENT: Negative.  Negative for trouble swallowing and voice change.   Eyes: Negative.   Respiratory: Negative.  Negative for cough, choking, chest tightness, shortness of breath and stridor.   Cardiovascular: Negative.  Negative for chest pain, palpitations and leg swelling.  Gastrointestinal: Negative.  Negative for nausea, vomiting, abdominal pain, diarrhea and constipation.  Endocrine: Negative.   Genitourinary: Negative.   Musculoskeletal:  Positive for neck pain. Negative for back pain, gait problem and neck stiffness.  Skin: Negative.  Negative for color change and rash.  Allergic/Immunologic: Negative.   Neurological: Positive for weakness and numbness. Negative for dizziness, light-headedness and headaches.  Hematological: Negative.  Negative for adenopathy. Does not bruise/bleed easily.  Psychiatric/Behavioral: Negative.     Objective:  BP 118/64 mmHg  Pulse 71  Temp(Src) 98.3 F (36.8 C) (Oral)  Resp 16  Ht 5\' 9"  (1.753 m)  Wt 222 lb (100.699 kg)  BMI 32.77 kg/m2  SpO2 97%  BP Readings from Last 3 Encounters:  09/30/15 118/64  06/19/15 120/70  08/13/14 142/88    Wt Readings from Last 3 Encounters:  09/30/15 222 lb (100.699 kg)  06/19/15 213 lb (96.616 kg)  08/13/14 226 lb (102.513 kg)    Physical Exam  Constitutional: No distress.  HENT:  Mouth/Throat: Oropharynx is clear and moist. No oropharyngeal exudate.  Eyes: Conjunctivae are normal. Right eye exhibits no discharge. Left eye exhibits no discharge. No scleral icterus.  Neck: Normal range of motion. Neck supple. No JVD present. No tracheal deviation and no edema present. No thyromegaly present.  Cardiovascular: Normal rate, regular rhythm, normal heart sounds and intact distal pulses.  Exam reveals no gallop and no friction rub.   No murmur heard. Pulmonary/Chest: Effort normal and breath sounds normal. No stridor. No respiratory distress. He has no wheezes. He has no  rales. He exhibits no tenderness.  Abdominal: Soft. Bowel sounds are normal. He exhibits no distension and no mass. There is no tenderness. There is no rebound and no guarding.  Musculoskeletal: Normal range of motion. He exhibits no edema or tenderness.       Cervical back: Normal. He exhibits normal range of motion, no tenderness, no bony tenderness, no swelling, no edema, no deformity, no laceration, no pain, no spasm and normal pulse.  Lymphadenopathy:    He has no cervical  adenopathy.  Neurological: He is alert. He has normal strength. He displays no atrophy, no tremor and normal reflexes. No cranial nerve deficit or sensory deficit. He exhibits normal muscle tone. He displays a negative Romberg sign. He displays no seizure activity. Coordination and gait normal.  Reflex Scores:      Tricep reflexes are 1+ on the right side and 1+ on the left side.      Bicep reflexes are 1+ on the right side and 1+ on the left side.      Brachioradialis reflexes are 1+ on the right side and 1+ on the left side.      Patellar reflexes are 1+ on the right side and 1+ on the left side.      Achilles reflexes are 1+ on the right side and 1+ on the left side. Skin: Skin is warm and dry. No rash noted. He is not diaphoretic. No erythema. No pallor.  Vitals reviewed.   Lab Results  Component Value Date   WBC 8.4 06/19/2015   HGB 15.7 06/19/2015   HCT 46.5 06/19/2015   PLT 284.0 06/19/2015   GLUCOSE 279* 06/19/2015   CHOL 163 06/19/2015   TRIG 296.0* 06/19/2015   HDL 38.00* 06/19/2015   LDLDIRECT 97.0 06/19/2015   LDLCALC 98 08/13/2014   ALT 32 08/13/2014   AST 21 08/13/2014   NA 135 06/19/2015   K 4.1 06/19/2015   CL 101 06/19/2015   CREATININE 1.04 06/19/2015   BUN 19 06/19/2015   CO2 29 06/19/2015   TSH 1.57 08/13/2014   HGBA1C 9.8* 06/19/2015   MICROALBUR 0.7 06/19/2015    Dg Elbow Complete Right  03/20/2014  CLINICAL DATA:  Pain and swelling EXAM: RIGHT ELBOW - COMPLETE 3+ VIEW COMPARISON:  None. FINDINGS: Four views of the right elbow submitted. No acute fracture or subluxation. No posterior fat pad sign. There is soft tissue swelling olecranon region suspicious for olecranon bursitis. IMPRESSION: No acute fracture or subluxation. Soft tissue swelling olecranon region suspicious for bursitis or cellulitis. Electronically Signed   By: Lahoma Crocker M.D.   On: 03/20/2014 17:01  Dg Cervical Spine Complete  09/30/2015  CLINICAL DATA:  Right shoulder pain x6 weeks, neck  pain, numbness in fingers EXAM: CERVICAL SPINE - COMPLETE 4+ VIEW COMPARISON:  None. FINDINGS: Cervical spine is visualized to the bottom of C7 on the lateral view. Normal cervical lordosis. No evidence of fracture or dislocation. Vertebral body heights and intervertebral disc spaces are maintained. Dens appears intact. Lateral masses C1 are symmetric. No prevertebral soft tissue swelling. Mild degenerative changes at C6-7. Visualized lung apices are clear. IMPRESSION: No fracture or dislocation is seen. Mild degenerative changes at C6-7. Electronically Signed   By: Julian Hy M.D.   On: 09/30/2015 17:30    Assessment & Plan:   Patrick Parker was seen today for neck pain.  Diagnoses and all orders for this visit:  Essential hypertension- his BP is well controlled  Type 2 diabetes mellitus with complication,  with long-term current use of insulin (HCC)  Need for influenza vaccination -     Flu Vaccine QUAD 36+ mos IM  Cervical radiculitis- he has right-sided neck pain and radicular symptoms but his neurologic exam is normal. His plain film only shows minimal degenerative disc disease. I've asked him to have an MRI done of the cervical spine to see if there is nerve impingement, spinal stenosis, or tumor involvement. In the meantime he will start taking meloxicam for the pain and will continue Tylenol as needed. -     DG Cervical Spine Complete; Future -     meloxicam (MOBIC) 15 MG tablet; Take 1 tablet (15 mg total) by mouth daily.   I am having Patrick Parker start on meloxicam. I am also having him maintain his aspirin, Saw Palmetto, multivitamin, CINNAMON PO, metFORMIN, atenolol, simvastatin, amLODipine, temazepam, and benazepril.  Meds ordered this encounter  Medications  . meloxicam (MOBIC) 15 MG tablet    Sig: Take 1 tablet (15 mg total) by mouth daily.    Dispense:  30 tablet    Refill:  3     Follow-up: Return in about 6 weeks (around 11/11/2015).  Scarlette Calico, MD

## 2015-10-01 ENCOUNTER — Encounter: Payer: Self-pay | Admitting: Internal Medicine

## 2015-10-09 ENCOUNTER — Other Ambulatory Visit: Payer: Self-pay | Admitting: Internal Medicine

## 2015-11-10 ENCOUNTER — Other Ambulatory Visit: Payer: Self-pay | Admitting: Internal Medicine

## 2015-12-07 ENCOUNTER — Other Ambulatory Visit: Payer: Self-pay | Admitting: Internal Medicine

## 2015-12-15 ENCOUNTER — Other Ambulatory Visit: Payer: Self-pay | Admitting: Internal Medicine

## 2015-12-15 NOTE — Telephone Encounter (Signed)
fxd

## 2016-04-12 ENCOUNTER — Other Ambulatory Visit: Payer: Self-pay | Admitting: Internal Medicine

## 2016-04-12 NOTE — Telephone Encounter (Signed)
Faxed script back to [facility].../lmb 

## 2016-05-20 ENCOUNTER — Other Ambulatory Visit: Payer: Self-pay | Admitting: Internal Medicine

## 2016-07-13 ENCOUNTER — Encounter: Payer: Self-pay | Admitting: *Deleted

## 2016-07-20 ENCOUNTER — Other Ambulatory Visit: Payer: Self-pay | Admitting: Internal Medicine

## 2016-07-21 ENCOUNTER — Other Ambulatory Visit (INDEPENDENT_AMBULATORY_CARE_PROVIDER_SITE_OTHER): Payer: Commercial Managed Care - HMO

## 2016-07-21 ENCOUNTER — Encounter: Payer: Self-pay | Admitting: Internal Medicine

## 2016-07-21 ENCOUNTER — Ambulatory Visit (INDEPENDENT_AMBULATORY_CARE_PROVIDER_SITE_OTHER): Payer: Commercial Managed Care - HMO | Admitting: Internal Medicine

## 2016-07-21 VITALS — BP 138/88 | HR 86 | Temp 98.2°F | Resp 16 | Ht 69.0 in | Wt 210.0 lb

## 2016-07-21 DIAGNOSIS — E118 Type 2 diabetes mellitus with unspecified complications: Secondary | ICD-10-CM

## 2016-07-21 DIAGNOSIS — E785 Hyperlipidemia, unspecified: Secondary | ICD-10-CM

## 2016-07-21 DIAGNOSIS — Z794 Long term (current) use of insulin: Secondary | ICD-10-CM | POA: Diagnosis not present

## 2016-07-21 DIAGNOSIS — Z0000 Encounter for general adult medical examination without abnormal findings: Secondary | ICD-10-CM

## 2016-07-21 DIAGNOSIS — R7989 Other specified abnormal findings of blood chemistry: Secondary | ICD-10-CM | POA: Diagnosis not present

## 2016-07-21 DIAGNOSIS — G4700 Insomnia, unspecified: Secondary | ICD-10-CM | POA: Diagnosis not present

## 2016-07-21 DIAGNOSIS — I10 Essential (primary) hypertension: Secondary | ICD-10-CM

## 2016-07-21 LAB — CBC WITH DIFFERENTIAL/PLATELET
Basophils Absolute: 0 K/uL (ref 0.0–0.1)
Basophils Relative: 0.3 % (ref 0.0–3.0)
Eosinophils Absolute: 0.2 K/uL (ref 0.0–0.7)
Eosinophils Relative: 2.5 % (ref 0.0–5.0)
HCT: 46.5 % (ref 39.0–52.0)
Hemoglobin: 16.1 g/dL (ref 13.0–17.0)
Lymphocytes Relative: 24.2 % (ref 12.0–46.0)
Lymphs Abs: 1.7 K/uL (ref 0.7–4.0)
MCHC: 34.5 g/dL (ref 30.0–36.0)
MCV: 91.1 fl (ref 78.0–100.0)
Monocytes Absolute: 0.5 K/uL (ref 0.1–1.0)
Monocytes Relative: 7.8 % (ref 3.0–12.0)
Neutro Abs: 4.5 K/uL (ref 1.4–7.7)
Neutrophils Relative %: 65.2 % (ref 43.0–77.0)
Platelets: 297 K/uL (ref 150.0–400.0)
RBC: 5.11 Mil/uL (ref 4.22–5.81)
RDW: 12.4 % (ref 11.5–15.5)
WBC: 6.9 K/uL (ref 4.0–10.5)

## 2016-07-21 LAB — LIPID PANEL
Cholesterol: 166 mg/dL (ref 0–200)
HDL: 39.9 mg/dL
NonHDL: 126.41
Total CHOL/HDL Ratio: 4
Triglycerides: 289 mg/dL — ABNORMAL HIGH (ref 0.0–149.0)
VLDL: 57.8 mg/dL — ABNORMAL HIGH (ref 0.0–40.0)

## 2016-07-21 LAB — COMPREHENSIVE METABOLIC PANEL
ALT: 24 U/L (ref 0–53)
AST: 14 U/L (ref 0–37)
Albumin: 4.4 g/dL (ref 3.5–5.2)
Alkaline Phosphatase: 71 U/L (ref 39–117)
BUN: 18 mg/dL (ref 6–23)
CO2: 28 mEq/L (ref 19–32)
Calcium: 9.2 mg/dL (ref 8.4–10.5)
Chloride: 100 mEq/L (ref 96–112)
Creatinine, Ser: 1.06 mg/dL (ref 0.40–1.50)
GFR: 75.7 mL/min (ref >60.00)
Glucose, Bld: 376 mg/dL — ABNORMAL HIGH (ref 70–99)
Potassium: 4.3 mEq/L (ref 3.5–5.1)
Sodium: 134 mEq/L — ABNORMAL LOW (ref 135–145)
Total Bilirubin: 0.4 mg/dL (ref 0.2–1.2)
Total Protein: 6.7 g/dL (ref 6.0–8.3)

## 2016-07-21 LAB — URINALYSIS, ROUTINE W REFLEX MICROSCOPIC
Bilirubin Urine: NEGATIVE
Hgb urine dipstick: NEGATIVE
Leukocytes, UA: NEGATIVE
Nitrite: NEGATIVE
RBC / HPF: NONE SEEN
Specific Gravity, Urine: 1.01
Total Protein, Urine: NEGATIVE
Urine Glucose: >=1000 — AB
Urobilinogen, UA: 0.2
pH: 5.5 (ref 5.0–8.0)

## 2016-07-21 LAB — POCT GLUCOSE (DEVICE FOR HOME USE): POC Glucose: 403 mg/dl — AB (ref 70–99)

## 2016-07-21 LAB — HIV ANTIBODY (ROUTINE TESTING W REFLEX): HIV 1&2 Ab, 4th Generation: NONREACTIVE

## 2016-07-21 LAB — LDL CHOLESTEROL, DIRECT: Direct LDL: 91 mg/dL

## 2016-07-21 LAB — TSH: TSH: 2.2 u[IU]/mL (ref 0.35–4.50)

## 2016-07-21 LAB — MICROALBUMIN / CREATININE URINE RATIO
Creatinine,U: 62.6 mg/dL
Microalb Creat Ratio: 1.1 mg/g (ref 0.0–30.0)
Microalb, Ur: <0.7 mg/dL (ref 0.0–1.9)

## 2016-07-21 LAB — POCT GLYCOSYLATED HEMOGLOBIN (HGB A1C): Hemoglobin A1C: 11.8

## 2016-07-21 LAB — PSA: PSA: 0.92 ng/mL (ref 0.10–4.00)

## 2016-07-21 MED ORDER — ONETOUCH VERIO FLEX SYSTEM W/DEVICE KIT: 1.0000 | PACK | Freq: Three times a day (TID) | 0 refills | Status: AC

## 2016-07-21 MED ORDER — INSULIN GLARGINE-LIXISENATIDE 100-33 UNT-MCG/ML ~~LOC~~ SOPN: 30.0000 [IU] | PEN_INJECTOR | Freq: Every day | SUBCUTANEOUS | 11 refills | Status: DC

## 2016-07-21 MED ORDER — GLUCOSE BLOOD VI STRP: ORAL_STRIP | 12 refills | Status: DC

## 2016-07-21 MED ORDER — INSULIN PEN NEEDLE 31G X 5 MM MISC: 1.0000 | Freq: Every day | 3 refills | Status: DC

## 2016-07-21 MED ORDER — TEMAZEPAM 30 MG PO CAPS: ORAL_CAPSULE | ORAL | 5 refills | Status: DC

## 2016-07-21 NOTE — Patient Instructions (Signed)

## 2016-07-21 NOTE — Progress Notes (Signed)
Subjective:  Patient ID: Patrick Parker, male    DOB: 1956-05-24  Age: 60 y.o. MRN: [id number]  CC: Hypertension; Hyperlipidemia; Diabetes; and Annual Exam   HPI Patrick Parker presents for CPX.  He thinks his blood sugars have been well controlled. He tells me the last time he checked his BS was about a week or 2 ago and his blood sugar wass about 140. He tells me over the last 6 months he has intentionally lost about 25 pounds with diet and exercise. He denies visual disturbance, polyuria, polydipsia, polyphagia.  He also tells me his blood pressure is well-controlled on the combination of amlodipine, atenolol, and benazepril. He denies recent episodes of headache/blurred vision/chest pain/shortness of breath/near syncope/palpitations/edema/fatigue.  Outpatient Medications Prior to Visit  Medication Sig Dispense Refill  . amLODipine (NORVASC) 10 MG tablet TAKE 1 TABLET BY MOUTH DAILY 90 tablet 3  . aspirin 325 MG tablet Take 325 mg by mouth daily.      Marland Kitchen atenolol (TENORMIN) 50 MG tablet TAKE 1 TABLET BY MOUTH EVERY DAY 90 tablet 2  . benazepril (LOTENSIN) 20 MG tablet TAKE 1 TABLET(20 MG) BY MOUTH DAILY 90 tablet 2  . meloxicam (MOBIC) 15 MG tablet Take 1 tablet (15 mg total) by mouth daily. 30 tablet 3  . metFORMIN (GLUCOPHAGE) 1000 MG tablet TAKE 1 TABLET BY MOUTH TWICE DAILY WITH A MEAL 180 tablet 1  . Multiple Vitamin (MULTIVITAMIN) tablet Take 1 tablet by mouth daily.    . simvastatin (ZOCOR) 20 MG tablet TAKE 1 TABLET BY MOUTH EVERY EVENING 90 tablet 1  . CINNAMON PO Take 1,000 mg by mouth daily.     . Saw Palmetto 450 MG CAPS Take by mouth. Takes one tablet by mouth as directed     . temazepam (RESTORIL) 30 MG capsule TAKE 1 CAPSULE BY MOUTH EVERY DAY AT BEDTIME AS NEEDED FOR SLEEP 30 capsule 2   No facility-administered medications prior to visit.     ROS Review of Systems  Constitutional: Positive for unexpected weight change. Negative for activity change, appetite change,  chills, diaphoresis, fatigue and fever.  HENT: Negative.  Negative for facial swelling, sinus pressure, sore throat and trouble swallowing.   Eyes: Negative.  Negative for photophobia and visual disturbance.  Respiratory: Negative.  Negative for cough, choking, chest tightness, shortness of breath and stridor.   Cardiovascular: Negative.  Negative for chest pain, palpitations and leg swelling.  Gastrointestinal: Negative.  Negative for abdominal pain, constipation, diarrhea and nausea.  Endocrine: Negative.  Negative for polydipsia, polyphagia and polyuria.  Genitourinary: Negative.  Negative for decreased urine volume, difficulty urinating, discharge, dysuria, frequency, scrotal swelling, testicular pain and urgency.  Musculoskeletal: Negative.  Negative for back pain, myalgias and neck pain.  Skin: Negative.  Negative for color change and rash.  Allergic/Immunologic: Negative.   Neurological: Negative.   Hematological: Negative.  Negative for adenopathy. Does not bruise/bleed easily.  Psychiatric/Behavioral: Positive for sleep disturbance. Negative for behavioral problems, confusion, decreased concentration, dysphoric mood, self-injury and suicidal ideas. The patient is not nervous/anxious.     Objective:  BP 138/88 (BP Location: Left Arm, Patient Position: Sitting, Cuff Size: Normal)   Pulse 86   Temp 98.2 F (36.8 C) (Oral)   Resp 16   Ht '5\' 9"'$  (1.753 m)   Wt 210 lb (95.3 kg)   SpO2 96%   BMI 31.01 kg/m   BP Readings from Last 3 Encounters:  07/21/16 138/88  09/30/15 118/64  06/19/15 120/70  Wt Readings from Last 3 Encounters:  07/21/16 210 lb (95.3 kg)  09/30/15 222 lb (100.7 kg)  06/19/15 213 lb (96.6 kg)    Physical Exam  Genitourinary:  Genitourinary Comments: He was not willing to undress for a GU and rectal examination, he stated he did not have enough time today to do it.    Lab Results  Component Value Date   WBC 6.9 07/21/2016   HGB 16.1 07/21/2016    HCT 46.5 07/21/2016   PLT 297.0 07/21/2016   GLUCOSE 376 (H) 07/21/2016   CHOL 166 07/21/2016   TRIG 289.0 (H) 07/21/2016   HDL 39.90 07/21/2016   LDLDIRECT 91.0 07/21/2016   LDLCALC 98 08/13/2014   ALT 24 07/21/2016   AST 14 07/21/2016   NA 134 (L) 07/21/2016   K 4.3 07/21/2016   CL 100 07/21/2016   CREATININE 1.06 07/21/2016   BUN 18 07/21/2016   CO2 28 07/21/2016   TSH 2.20 07/21/2016   PSA 0.92 07/21/2016   HGBA1C 11.8 07/21/2016   MICROALBUR <0.7 07/21/2016    Dg Cervical Spine Complete  Result Date: 09/30/2015 CLINICAL DATA:  Right shoulder pain x6 weeks, neck pain, numbness in fingers EXAM: CERVICAL SPINE - COMPLETE 4+ VIEW COMPARISON:  None. FINDINGS: Cervical spine is visualized to the bottom of C7 on the lateral view. Normal cervical lordosis. No evidence of fracture or dislocation. Vertebral body heights and intervertebral disc spaces are maintained. Dens appears intact. Lateral masses C1 are symmetric. No prevertebral soft tissue swelling. Mild degenerative changes at C6-7. Visualized lung apices are clear. IMPRESSION: No fracture or dislocation is seen. Mild degenerative changes at C6-7. Electronically Signed   By: Charline Bills M.D.   On: 09/30/2015 17:30    Assessment & Plan:   Patrick Parker was seen today for hypertension, hyperlipidemia, diabetes and annual exam.  Diagnoses and all orders for this visit:  Essential hypertension- his blood pressure is adequately well controlled, electrolytes and renal function are stable. -     Comprehensive metabolic panel; Future -     CBC with Differential/Platelet; Future -     Urinalysis, Routine w reflex microscopic (not at [facility]); Future  Type 2 diabetes mellitus with complication, with long-term current use of insulin (HCC)- his blood sugar is over 400 and his A1c is up to 11.8%. I've asked him to add a GLP-1 agonist as well as basal insulin to his regimen of metformin. He was given his first dose of Soliqua in the office  today. Will start Soliqua at 30 units and will bring her back in the office in 3 weeks and anticipate increasing him to 60 units. He is also due for his annual eye exam. I've asked him to be seen for diabetic education and to start monitoring his blood sugar more frequently. -     Comprehensive metabolic panel; Future -     Cancel: Hemoglobin A1c; Future -     Microalbumin / creatinine urine ratio; Future -     POCT Glucose (Device for Home Use) -     POCT glycosylated hemoglobin (Hb A1C) -     Insulin Pen Needle 31G X 5 MM MISC; 1 Act by Does not apply route daily. -     Insulin Glargine-Lixisenatide (SOLIQUA) 100-33 UNT-MCG/ML SOPN; Inject 30 Units into the skin daily. -     Ambulatory referral to Ophthalmology -     Amb Referral to Nutrition and Diabetic E -     Blood Glucose Monitoring Suppl [facility]  [place]) w/Device KIT; 1 Act by Does not apply route 3 (three) times daily. -     glucose blood (ONETOUCH VERIO) test strip; Use TID -     C-peptide; Future  Hyperlipidemia with target LDL less than 100- he has achieved his LDL goal and is doing well on the statin. -     Lipid panel; Future -     TSH; Future  Insomnia -     temazepam (RESTORIL) 30 MG capsule; TAKE 1 CAPSULE BY MOUTH EVERY DAY AT BEDTIME AS NEEDED FOR SLEEP  Routine general medical examination at a health care facility- exam completed, labs ordered and reviewed, vaccines reviewed and updated, patient education material was given. -     PSA; Future -     Hepatitis C antibody; Future -     HIV antibody; Future   I have discontinued Patrick Parker Palmetto and CINNAMON PO. I am also having him start on Insulin Pen Needle, Insulin Glargine-Lixisenatide, ONETOUCH VERIO FLEX SYSTEM, and glucose blood. Additionally, I am having him maintain his aspirin, multivitamin, meloxicam, amLODipine, metFORMIN, benazepril, simvastatin, atenolol, and temazepam.  Meds ordered this encounter  Medications  . temazepam (RESTORIL)  30 MG capsule    Sig: TAKE 1 CAPSULE BY MOUTH EVERY DAY AT BEDTIME AS NEEDED FOR SLEEP    Dispense:  30 capsule    Refill:  5  . Insulin Pen Needle 31G X 5 MM MISC    Sig: 1 Act by Does not apply route daily.    Dispense:  100 each    Refill:  3  . Insulin Glargine-Lixisenatide (SOLIQUA) 100-33 UNT-MCG/ML SOPN    Sig: Inject 30 Units into the skin daily.    Dispense:  3 mL    Refill:  11  . Blood Glucose Monitoring Suppl (ONETOUCH VERIO FLEX SYSTEM) w/Device KIT    Sig: 1 Act by Does not apply route 3 (three) times daily.    Dispense:  2 kit    Refill:  0  . glucose blood (ONETOUCH VERIO) test strip    Sig: Use TID    Dispense:  100 each    Refill:  12     Follow-up: Return in about 3 weeks (around 08/11/2016).  Scarlette Calico, MD

## 2016-07-21 NOTE — Progress Notes (Signed)
Pre visit review using our clinic review tool, if applicable. No additional management support is needed unless otherwise documented below in the visit note. 

## 2016-07-22 LAB — HEPATITIS C ANTIBODY: HCV Ab: NEGATIVE

## 2016-07-22 LAB — C-PEPTIDE: C-Peptide: 2.95 ng/mL (ref 0.80–3.85)

## 2016-07-27 ENCOUNTER — Telehealth: Payer: Self-pay

## 2016-07-27 NOTE — Telephone Encounter (Signed)
PA initiated via CoverMyMeds key [id number]

## 2016-07-30 NOTE — Telephone Encounter (Signed)
PA DENIED, not alternative medications provide, please advise. Thanks!

## 2016-08-09 ENCOUNTER — Encounter: Payer: Self-pay | Admitting: Internal Medicine

## 2016-09-01 ENCOUNTER — Encounter: Payer: Self-pay | Admitting: Internal Medicine

## 2016-10-27 ENCOUNTER — Other Ambulatory Visit: Payer: Self-pay | Admitting: Internal Medicine

## 2016-11-02 ENCOUNTER — Encounter: Payer: Self-pay | Admitting: Internal Medicine

## 2016-12-01 ENCOUNTER — Telehealth: Payer: Self-pay

## 2016-12-01 NOTE — Telephone Encounter (Signed)
PA started  Key: [id number]

## 2016-12-06 ENCOUNTER — Other Ambulatory Visit: Payer: Self-pay | Admitting: Internal Medicine

## 2016-12-06 DIAGNOSIS — G4700 Insomnia, unspecified: Secondary | ICD-10-CM

## 2016-12-07 ENCOUNTER — Other Ambulatory Visit: Payer: Self-pay | Admitting: Internal Medicine

## 2016-12-07 DIAGNOSIS — E118 Type 2 diabetes mellitus with unspecified complications: Secondary | ICD-10-CM

## 2016-12-07 MED ORDER — INSULIN GLARGINE 300 UNIT/ML ~~LOC~~ SOPN: 50.0000 [IU] | PEN_INJECTOR | Freq: Every day | SUBCUTANEOUS | 11 refills | Status: DC

## 2016-12-07 MED ORDER — EXENATIDE ER 2 MG/0.85ML ~~LOC~~ AUIJ: 1.0000 | AUTO-INJECTOR | SUBCUTANEOUS | 11 refills | Status: DC

## 2016-12-07 NOTE — Telephone Encounter (Signed)
Pt and pharmacy informed that PA was approved.

## 2016-12-20 ENCOUNTER — Ambulatory Visit (AMBULATORY_SURGERY_CENTER): Payer: Self-pay | Admitting: *Deleted

## 2016-12-20 VITALS — Ht 69.0 in | Wt 228.0 lb

## 2016-12-20 DIAGNOSIS — Z86010 Personal history of colonic polyps: Secondary | ICD-10-CM

## 2016-12-20 MED ORDER — NA SULFATE-K SULFATE-MG SULF 17.5-3.13-1.6 GM/177ML PO SOLN: ORAL | 0 refills | Status: DC

## 2016-12-20 NOTE — Progress Notes (Signed)
Patient denies any allergies to eggs or soy. Patient denies any problems with anesthesia/sedation. Patient denies any oxygen use at home and does not take any diet/weight loss medications. EMMI education declined by patient. Encouraged patient to speak with PCP about medications. He states he was unsure if he should be on new medication he pick up from pharmacy.

## 2016-12-21 LAB — HM DIABETES EYE EXAM

## 2016-12-22 ENCOUNTER — Encounter: Payer: Self-pay | Admitting: Internal Medicine

## 2017-01-02 ENCOUNTER — Other Ambulatory Visit: Payer: Self-pay | Admitting: Internal Medicine

## 2017-01-02 DIAGNOSIS — G4700 Insomnia, unspecified: Secondary | ICD-10-CM

## 2017-01-03 ENCOUNTER — Encounter: Payer: Commercial Managed Care - HMO | Admitting: Internal Medicine

## 2017-01-04 ENCOUNTER — Telehealth: Payer: Self-pay | Admitting: Internal Medicine

## 2017-01-04 ENCOUNTER — Emergency Department (HOSPITAL_COMMUNITY): Payer: Commercial Managed Care - HMO

## 2017-01-04 ENCOUNTER — Encounter (HOSPITAL_COMMUNITY): Payer: Self-pay | Admitting: Emergency Medicine

## 2017-01-04 ENCOUNTER — Emergency Department (HOSPITAL_COMMUNITY)
Admission: EM | Admit: 2017-01-04 | Discharge: 2017-01-05 | Disposition: A | Discharge status: Home / Self Care | Payer: Commercial Managed Care - HMO | Attending: Emergency Medicine | Admitting: Emergency Medicine

## 2017-01-04 DIAGNOSIS — R0781 Pleurodynia: Secondary | ICD-10-CM | POA: Diagnosis not present

## 2017-01-04 DIAGNOSIS — I10 Essential (primary) hypertension: Secondary | ICD-10-CM | POA: Diagnosis not present

## 2017-01-04 DIAGNOSIS — J069 Acute upper respiratory infection, unspecified: Secondary | ICD-10-CM | POA: Insufficient documentation

## 2017-01-04 DIAGNOSIS — Z79899 Other long term (current) drug therapy: Secondary | ICD-10-CM | POA: Diagnosis not present

## 2017-01-04 DIAGNOSIS — R079 Chest pain, unspecified: Secondary | ICD-10-CM

## 2017-01-04 DIAGNOSIS — Z794 Long term (current) use of insulin: Secondary | ICD-10-CM | POA: Insufficient documentation

## 2017-01-04 DIAGNOSIS — F17290 Nicotine dependence, other tobacco product, uncomplicated: Secondary | ICD-10-CM | POA: Diagnosis not present

## 2017-01-04 DIAGNOSIS — E119 Type 2 diabetes mellitus without complications: Secondary | ICD-10-CM | POA: Insufficient documentation

## 2017-01-04 LAB — CBC
HCT: 45 % (ref 39.0–52.0)
Hemoglobin: 15.9 g/dL (ref 13.0–17.0)
MCH: 31.2 pg (ref 26.0–34.0)
MCHC: 35.3 g/dL (ref 30.0–36.0)
MCV: 88.4 fL (ref 78.0–100.0)
Platelets: 309 10*3/uL (ref 150–400)
RBC: 5.09 MIL/uL (ref 4.22–5.81)
RDW: 12.4 % (ref 11.5–15.5)
WBC: 7.8 10*3/uL (ref 4.0–10.5)

## 2017-01-04 LAB — BASIC METABOLIC PANEL
Anion gap: 9 (ref 5–15)
BUN: 15 mg/dL (ref 6–20)
CO2: 27 mmol/L (ref 22–32)
Calcium: 9.8 mg/dL (ref 8.9–10.3)
Chloride: 100 mmol/L — ABNORMAL LOW (ref 101–111)
Creatinine, Ser: 0.99 mg/dL (ref 0.61–1.24)
GFR calc Af Amer: >60 mL/min (ref >60)
GFR calc non Af Amer: >60 mL/min (ref >60)
Glucose, Bld: 169 mg/dL — ABNORMAL HIGH (ref 65–99)
Potassium: 4.1 mmol/L (ref 3.5–5.1)
Sodium: 136 mmol/L (ref 135–145)

## 2017-01-04 LAB — I-STAT TROPONIN, ED: Troponin i, poc: 0 ng/mL (ref 0.00–0.08)

## 2017-01-04 MED ORDER — KETOROLAC TROMETHAMINE 15 MG/ML IJ SOLN
15.0000 mg | Freq: Once | INTRAMUSCULAR | Status: AC
  Administered 2017-01-04: 15 mg via INTRAMUSCULAR
  Filled 2017-01-04: qty 1

## 2017-01-04 NOTE — ED Triage Notes (Signed)
Pt reports left sided chest pain that radiates to his shoulder for the past few days. Pt reports sitting still helps pain. Pt a/ox4, resp e/u, skin warm and dry, nad.

## 2017-01-04 NOTE — ED Provider Notes (Signed)
[facility] DEPT Provider Note   CSN: [id number] Arrival date & time: 01/04/17  1710  By signing my name below, I, Arianna Nassar, attest that this documentation has been prepared under the direction and in the presence of Merryl Hacker, MD.  Electronically Signed: Julien Nordmann, ED Scribe. 01/04/17. 11:47 PM.    History   Chief Complaint Chief Complaint  Patient presents with  . Chest Pain     The history is provided by the patient. No language interpreter was used.   HPI Comments: Patrick Parker is a 61 y.o. male who has a PMhx of DM, GERD, HLD, and DMII  presents to the Emergency Department complaining of 4/10, intermittent, left sided chest pain x yesterday. Pt says the pain radiates to left shoulder. He describes the pain as a sharp and numb sensation. He notes associated dry cough and sore throat due to having cold-like symptoms. Pt reports that coughing and taking a deep breath aggravates his pain while sitting still modifies his pain. He has not taken any medication to alleviate his pain. Pt denies any family hx CAD or MI before the age of 58. Pt has not had a stress test performed nor has he seen a cardiologist in the past. Pt does not have a hx of DVT in the legs or lungs and he has not had any recent prolonged travel. He denies fever, leg swelling, nausea, and vomiting.  Past Medical History:  Diagnosis Date  . Alcoholism ([place])   . Diabetes mellitus   . GERD (gastroesophageal reflux disease)   . Hyperlipemia   . Hypertension   . Obesity   . Sleep apnea     Patient Active Problem List   Diagnosis Date Noted  . Routine general medical examination at a health care facility 07/21/2016  . Cervical radiculitis 09/30/2015  . Insomnia 08/13/2014  . GERD (gastroesophageal reflux disease) 11/16/2012  . Hypertension 09/21/2011  . Hyperlipidemia with target LDL less than 100 09/21/2011  . Type II diabetes mellitus with manifestations ([place]) 09/21/2011  . Sleep apnea  09/21/2011  . Alcohol abuse, in remission 09/21/2011  . Substance abuse in remission 09/21/2011    Past Surgical History:  Procedure Laterality Date  . COLONOSCOPY    . [place] Medications    Prior to Admission medications   Medication Sig Start Date End Date Taking? Authorizing Provider  amLODipine (NORVASC) 10 MG tablet TAKE 1 TABLET BY MOUTH DAILY 10/27/16  Yes Janith Lima, MD  atenolol (TENORMIN) 50 MG tablet TAKE 1 TABLET BY MOUTH EVERY DAY 05/21/16  Yes Janith Lima, MD  benazepril (LOTENSIN) 20 MG tablet TAKE 1 TABLET(20 MG) BY MOUTH DAILY 05/21/16  Yes Janith Lima, MD  Blood Glucose Monitoring Suppl ([place]) w/Device KIT 1 Act by Does not apply route 3 (three) times daily. 07/21/16  Yes Janith Lima, MD  Exenatide ER (BYDUREON BCISE) 2 MG/0.85ML AUIJ Inject 1 Act into the skin once a week. 12/07/16  Yes Janith Lima, MD  glucose blood [facility] VERIO) test strip Use TID 07/21/16  Yes Janith Lima, MD  Insulin Glargine (TOUJEO SOLOSTAR) 300 UNIT/ML SOPN Inject 50 Units into the skin daily. Patient taking differently: Inject 30 Units into the skin every evening.  12/07/16  Yes Janith Lima, MD  Insulin Pen Needle 31G X 5 MM MISC 1 Act by Does not apply route daily. 07/21/16  Yes Janith Lima,  MD  metFORMIN (GLUCOPHAGE) 1000 MG tablet TAKE 1 TABLET BY MOUTH TWICE DAILY WITH A MEAL Patient taking differently: 1 tablet daily 10/27/16  Yes Janith Lima, MD  Multiple Vitamin (MULTIVITAMIN) tablet Take 1 tablet by mouth daily.   Yes Historical Provider, MD  simvastatin (ZOCOR) 20 MG tablet TAKE 1 TABLET BY MOUTH EVERY EVENING 05/21/16  Yes Janith Lima, MD  temazepam (RESTORIL) 30 MG capsule TAKE 1 CAPSULE BY MOUTH EVERY DAY AT BEDTIME AS NEEDED FOR SLEEP 07/21/16  Yes Janith Lima, MD  Na Sulfate-K Sulfate-Mg Sulf 17.5-3.13-1.6 GM/180ML SOLN Suprep (no substitutions)-TAKE AS DIRECTED. Patient not taking: Reported on 01/05/2017  12/20/16   Jerene Bears, MD  naproxen (NAPROSYN) 500 MG tablet Take 1 tablet (500 mg total) by mouth 2 (two) times daily. 01/05/17   Merryl Hacker, MD    Family History Family History  Problem Relation Age of Onset  . Prostate cancer Father   . Colon cancer Neg Hx   . Diabetes Neg Hx   . Heart disease Neg Hx   . Hyperlipidemia Neg Hx   . Hypertension Neg Hx   . Stroke Neg Hx     Social History Social History  Substance Use Topics  . Smoking status: Light Tobacco Smoker    Types: Cigars  . Smokeless tobacco: Current User    Types: Chew    Last attempt to quit: 12/05/2009  . Alcohol use No     Comment: Was a heavy drinker. Quit 20 yrs ago      Allergies   Patient has no known allergies.   Review of Systems Review of Systems  Constitutional: Negative for fever.  Respiratory: Positive for cough. Negative for shortness of breath.   Cardiovascular: Positive for chest pain. Negative for leg swelling.  Gastrointestinal: Negative for nausea and vomiting.  All other systems reviewed and are negative.    Physical Exam Updated Vital Signs BP 148/86   Pulse 72   Temp 98 F (36.7 C)   Resp 13   SpO2 94%   Physical Exam  Constitutional: He is oriented to person, place, and time. He appears well-developed and well-nourished.  HENT:  Head: Normocephalic and atraumatic.  Cardiovascular: Normal rate, regular rhythm and normal heart sounds.   No murmur heard. Pulmonary/Chest: Effort normal and breath sounds normal. He has no wheezes. He exhibits tenderness.  Left chest tenderness to palpation without crepitus no rash  Abdominal: Soft. Bowel sounds are normal. There is no tenderness. There is no rebound.  Musculoskeletal: He exhibits no edema.  Neurological: He is alert and oriented to person, place, and time.  Skin: Skin is warm and dry.  Psychiatric: He has a normal mood and affect.  Nursing note and vitals reviewed.    ED Treatments / Results  DIAGNOSTIC  STUDIES: Oxygen Saturation is 96% on RA, adequate by my interpretation.  COORDINATION OF CARE:  11:45 PM Discussed treatment plan with pt at bedside and pt agreed to plan.  Labs (all labs ordered are listed, but only abnormal results are displayed) Labs Reviewed  BASIC METABOLIC PANEL - Abnormal; Notable for the following:       Result Value   Chloride 100 (*)    Glucose, Bld 169 (*)    All other components within normal limits  CBC  D-DIMER, QUANTITATIVE (NOT AT [facility])  Randolm Idol, ED  Randolm Idol, ED    EKG  EKG Interpretation  Date/Time:  Tuesday January 04 2017 17:21:45 EST Ventricular  Rate:  65 PR Interval:  186 QRS Duration: 86 QT Interval:  376 QTC Calculation: 391 R Axis:   16 Text Interpretation:  Normal sinus rhythm Normal ECG Confirmed by Donnamae Muilenburg  MD, Carlise Stofer ([id number]) on 01/04/2017 11:19:13 PM       Radiology Dg Chest 2 View  Result Date: 01/04/2017 CLINICAL DATA:  Chest pain radiating to shoulder for several days. EXAM: CHEST  2 VIEW COMPARISON:  01/01/1998 CXR report FINDINGS: The heart size and mediastinal contours are within normal limits. Both lungs are clear. Minimal degenerative change along the dorsal spine. No acute osseous abnormality. IMPRESSION: No active cardiopulmonary disease. Electronically Signed   By: Ashley Royalty M.D.   On: 01/04/2017 18:07    Procedures Procedures (including critical care time)  Medications Ordered in ED Medications  ketorolac (TORADOL) 15 MG/ML injection 15 mg (15 mg Intramuscular Given 01/04/17 2353)     Initial Impression / Assessment and Plan / ED Course  I have reviewed the triage vital signs and the nursing notes.  Pertinent labs & imaging results that were available during my care of the patient were reviewed by me and considered in my medical decision making (see chart for details).     Patient presents with chest pain. Ongoing for the last several days. Pleuritic in nature. Atypical for ACS; however,  patient does have risk factors. EKG is normal. Initial troponin negative. Chest x-ray reassuring. He is otherwise nontoxic and vital signs are reassuring. Repeat troponin and d-dimer obtained which are both negative. Suspect this is related to his upper respiratory infection; however, given his risk factors, will give cardiology follow-up for formal stress testing.  After history, exam, and medical workup I feel the patient has been appropriately medically screened and is safe for discharge home. Pertinent diagnoses were discussed with the patient. Patient was given return precautions.   Final Clinical Impressions(s) / ED Diagnoses   Final diagnoses:  Nonspecific chest pain  Viral upper respiratory tract infection   I personally performed the services described in this documentation, which was scribed in my presence. The recorded information has been reviewed and is accurate.   New Prescriptions New Prescriptions   NAPROXEN (NAPROSYN) 500 MG TABLET    Take 1 tablet (500 mg total) by mouth 2 (two) times daily.     Merryl Hacker, MD 01/05/17 [id number]

## 2017-01-04 NOTE — Telephone Encounter (Signed)
yes

## 2017-01-04 NOTE — Telephone Encounter (Signed)
Pt called in said that Dr Ronnald Ramp needs him to have an office visit for refills.  He is going to be here from [employer] today or tomorrow.  He wants to know if he can work him in

## 2017-01-04 NOTE — Telephone Encounter (Signed)
Pt will be here at 8 tomorrow morning.

## 2017-01-05 ENCOUNTER — Ambulatory Visit (INDEPENDENT_AMBULATORY_CARE_PROVIDER_SITE_OTHER): Payer: Commercial Managed Care - HMO | Admitting: Internal Medicine

## 2017-01-05 ENCOUNTER — Encounter: Payer: Self-pay | Admitting: Internal Medicine

## 2017-01-05 ENCOUNTER — Telehealth: Payer: Self-pay | Admitting: Internal Medicine

## 2017-01-05 ENCOUNTER — Telehealth: Payer: Self-pay

## 2017-01-05 ENCOUNTER — Encounter: Payer: Commercial Managed Care - HMO | Admitting: Internal Medicine

## 2017-01-05 VITALS — BP 142/90 | HR 96 | Temp 98.2°F | Resp 16 | Ht 69.0 in | Wt 226.8 lb

## 2017-01-05 DIAGNOSIS — E118 Type 2 diabetes mellitus with unspecified complications: Secondary | ICD-10-CM

## 2017-01-05 DIAGNOSIS — I10 Essential (primary) hypertension: Secondary | ICD-10-CM

## 2017-01-05 DIAGNOSIS — F5101 Primary insomnia: Secondary | ICD-10-CM

## 2017-01-05 DIAGNOSIS — Z794 Long term (current) use of insulin: Secondary | ICD-10-CM | POA: Diagnosis not present

## 2017-01-05 LAB — POCT GLYCOSYLATED HEMOGLOBIN (HGB A1C): Hemoglobin A1C: 10.4

## 2017-01-05 LAB — D-DIMER, QUANTITATIVE: D-Dimer, Quant: <0.27 ug/mL-FEU (ref 0.00–0.50)

## 2017-01-05 LAB — I-STAT TROPONIN, ED: Troponin i, poc: 0 ng/mL (ref 0.00–0.08)

## 2017-01-05 MED ORDER — INSULIN GLARGINE 300 UNIT/ML ~~LOC~~ SOPN: 60.0000 [IU] | PEN_INJECTOR | Freq: Every day | SUBCUTANEOUS | 11 refills | Status: DC

## 2017-01-05 MED ORDER — METFORMIN HCL 1000 MG PO TABS: ORAL_TABLET | ORAL | 1 refills | Status: DC

## 2017-01-05 MED ORDER — TEMAZEPAM 30 MG PO CAPS: ORAL_CAPSULE | ORAL | 5 refills | Status: DC

## 2017-01-05 MED ORDER — NAPROXEN 500 MG PO TABS: 500.0000 mg | ORAL_TABLET | Freq: Two times a day (BID) | ORAL | 0 refills | Status: DC

## 2017-01-05 MED ORDER — EMPAGLIFLOZIN 10 MG PO TABS: 10.0000 mg | ORAL_TABLET | Freq: Every day | ORAL | 1 refills | Status: DC

## 2017-01-05 NOTE — Discharge Instructions (Signed)
You were seen today for chest pain. Your chest pain is likely related to your upper respiratory infection. Your heart test and screening tests for blood clots are negative. You should follow-up with cardiology for formal stress testing giving your risk factors.

## 2017-01-05 NOTE — Progress Notes (Signed)
Pre visit review using our clinic review tool, if applicable. No additional management support is needed unless otherwise documented below in the visit note. 

## 2017-01-05 NOTE — Telephone Encounter (Signed)
Patient wife calling to cancel procedure for this morning 01/05/17 at 10:30am. Patient wife states pt had to go to the hosp last night and is still there for chest pains.

## 2017-01-05 NOTE — Progress Notes (Signed)
Subjective:  Patient ID: Patrick Parker, male    DOB: 05-22-1956  Age: 61 y.o. MRN: [id number]  CC: Diabetes   HPI Valin Massie presents for follow-up. He was seen in the ERthe night prior to this visit for chest pain. He was driving his car and developed a throbbing sensation in his left upper chest area. He was ruled out for cardiac ischemia - his EKG and chest x-ray were normal. The chest pain has resolved since he left the ER. For the last few days he has had a mild nonproductive cough but he denies shortness of breath, diaphoresis, palpitations, or edema. He complains of chronic fatigue and says he has had a hard time getting his blood sugars down. He frequently has a blood sugar that goes up to 280 and 300 and hss had a couple of episodes of blurred vision though he tells me his eye exam was normal about 3 weeks ago.  Outpatient Medications Prior to Visit  Medication Sig Dispense Refill  . amLODipine (NORVASC) 10 MG tablet TAKE 1 TABLET BY MOUTH DAILY 90 tablet 0  . atenolol (TENORMIN) 50 MG tablet TAKE 1 TABLET BY MOUTH EVERY DAY 90 tablet 2  . benazepril (LOTENSIN) 20 MG tablet TAKE 1 TABLET(20 MG) BY MOUTH DAILY 90 tablet 2  . Blood Glucose Monitoring Suppl (ONETOUCH VERIO FLEX SYSTEM) w/Device KIT 1 Act by Does not apply route 3 (three) times daily. 2 kit 0  . Exenatide ER (BYDUREON BCISE) 2 MG/0.85ML AUIJ Inject 1 Act into the skin once a week. 4 pen 11  . glucose blood (ONETOUCH VERIO) test strip Use TID 100 each 12  . Insulin Pen Needle 31G X 5 MM MISC 1 Act by Does not apply route daily. 100 each 3  . Na Sulfate-K Sulfate-Mg Sulf 17.5-3.13-1.6 GM/180ML SOLN Suprep (no substitutions)-TAKE AS DIRECTED. 354 mL 0  . naproxen (NAPROSYN) 500 MG tablet Take 1 tablet (500 mg total) by mouth 2 (two) times daily. 30 tablet 0  . simvastatin (ZOCOR) 20 MG tablet TAKE 1 TABLET BY MOUTH EVERY EVENING 90 tablet 1  . Insulin Glargine (TOUJEO SOLOSTAR) 300 UNIT/ML SOPN Inject 50 Units into the skin  daily. (Patient taking differently: Inject 30 Units into the skin every evening. ) 1.5 mL 11  . metFORMIN (GLUCOPHAGE) 1000 MG tablet TAKE 1 TABLET BY MOUTH TWICE DAILY WITH A MEAL (Patient taking differently: 1 tablet daily) 180 tablet 0  . Multiple Vitamin (MULTIVITAMIN) tablet Take 1 tablet by mouth daily.    . temazepam (RESTORIL) 30 MG capsule TAKE 1 CAPSULE BY MOUTH EVERY DAY AT BEDTIME AS NEEDED FOR SLEEP 30 capsule 5   No facility-administered medications prior to visit.     ROS Review of Systems  Constitutional: Positive for fatigue. Negative for chills, diaphoresis and unexpected weight change.  HENT: Negative.   Eyes: Positive for visual disturbance. Negative for photophobia.  Respiratory: Positive for cough. Negative for chest tightness and shortness of breath.   Cardiovascular: Negative for chest pain, palpitations and leg swelling.  Gastrointestinal: Negative.  Negative for abdominal pain, constipation, diarrhea, nausea and vomiting.  Endocrine: Positive for polydipsia. Negative for polyphagia and polyuria.  Genitourinary: Negative.  Negative for difficulty urinating.  Musculoskeletal: Negative.  Negative for back pain, gait problem, myalgias and neck pain.  Skin: Negative.  Negative for color change and rash.  Allergic/Immunologic: Negative.   Neurological: Negative.  Negative for dizziness, weakness, light-headedness and numbness.  Hematological: Negative for adenopathy. Does not bruise/bleed easily.  Psychiatric/Behavioral: Positive for sleep disturbance. Negative for decreased concentration and dysphoric mood. The patient is not nervous/anxious.     Objective:  BP (!) 142/90 (BP Location: Left Arm, Patient Position: Sitting, Cuff Size: Large)   Pulse 96   Temp 98.2 F (36.8 C) (Oral)   Resp 16   Ht 5' 9" (1.753 m)   Wt 226 lb 12 oz (102.9 kg)   SpO2 96%   BMI 33.49 kg/m   BP Readings from Last 3 Encounters:  01/05/17 (!) 142/90  01/05/17 149/89  07/21/16  138/88    Wt Readings from Last 3 Encounters:  01/05/17 226 lb 12 oz (102.9 kg)  12/20/16 228 lb (103.4 kg)  07/21/16 210 lb (95.3 kg)    Physical Exam  Constitutional: He is oriented to person, place, and time. No distress.  HENT:  Mouth/Throat: Oropharynx is clear and moist. No oropharyngeal exudate.  Eyes: Conjunctivae are normal. Right eye exhibits no discharge. Left eye exhibits no discharge. No scleral icterus.  Neck: Normal range of motion. Neck supple. No JVD present. No tracheal deviation present. No thyromegaly present.  Cardiovascular: Normal rate, regular rhythm, normal heart sounds and intact distal pulses.  Exam reveals no gallop and no friction rub.   No murmur heard. Pulmonary/Chest: Effort normal and breath sounds normal. No stridor. No respiratory distress. He has no wheezes. He has no rales. He exhibits no tenderness.  Abdominal: Soft. Bowel sounds are normal. He exhibits no distension and no mass. There is no tenderness. There is no rebound and no guarding.  Musculoskeletal: Normal range of motion. He exhibits no edema, tenderness or deformity.  Lymphadenopathy:    He has no cervical adenopathy.  Neurological: He is oriented to person, place, and time.  Skin: Skin is warm and dry. No rash noted. He is not diaphoretic. No erythema. No pallor.  Psychiatric: He has a normal mood and affect. His behavior is normal. Judgment and thought content normal.  Vitals reviewed.   Lab Results  Component Value Date   WBC 7.8 01/04/2017   HGB 15.9 01/04/2017   HCT 45.0 01/04/2017   PLT 309 01/04/2017   GLUCOSE 169 (H) 01/04/2017   CHOL 166 07/21/2016   TRIG 289.0 (H) 07/21/2016   HDL 39.90 07/21/2016   LDLDIRECT 91.0 07/21/2016   LDLCALC 98 08/13/2014   ALT 24 07/21/2016   AST 14 07/21/2016   NA 136 01/04/2017   K 4.1 01/04/2017   CL 100 (L) 01/04/2017   CREATININE 0.99 01/04/2017   BUN 15 01/04/2017   CO2 27 01/04/2017   TSH 2.20 07/21/2016   PSA 0.92 07/21/2016     HGBA1C 10.4 01/05/2017   MICROALBUR <0.7 07/21/2016    Dg Chest 2 View  Result Date: 01/04/2017 CLINICAL DATA:  Chest pain radiating to shoulder for several days. EXAM: CHEST  2 VIEW COMPARISON:  01/01/1998 CXR report FINDINGS: The heart size and mediastinal contours are within normal limits. Both lungs are clear. Minimal degenerative change along the dorsal spine. No acute osseous abnormality. IMPRESSION: No active cardiopulmonary disease. Electronically Signed   By: Ashley Royalty M.D.   On: 01/04/2017 18:07    Assessment & Plan:   Massey was seen today for diabetes.  Diagnoses and all orders for this visit:  Type 2 diabetes mellitus with complication, with long-term current use of insulin ([facility])- his A1c is elevated at 10.4%, will increase the dose on his basal insulin, will add an SGLT2 inhibitor, will continue metformin at the current dose. -  POCT glycosylated hemoglobin (Hb A1C) -     Insulin Glargine (TOUJEO SOLOSTAR) 300 UNIT/ML SOPN; Inject 60 Units into the skin daily. -     metFORMIN (GLUCOPHAGE) 1000 MG tablet; TAKE 1 TABLET BY MOUTH TWICE DAILY WITH A MEAL -     empagliflozin (JARDIANCE) 10 MG TABS tablet; Take 10 mg by mouth daily.  Essential hypertension- his blood pressure is well controlled, electrolytes and renal function are normal.  Primary insomnia -     temazepam (RESTORIL) 30 MG capsule; TAKE 1 CAPSULE BY MOUTH EVERY DAY AT BEDTIME AS NEEDED FOR SLEEP  Type 2 diabetes mellitus with complication, without long-term current use of insulin (HCC) -     Insulin Glargine (TOUJEO SOLOSTAR) 300 UNIT/ML SOPN; Inject 60 Units into the skin daily.   I have discontinued Mr. Bresee's multivitamin. I have also changed his Insulin Glargine. Additionally, I am having him start on empagliflozin. Lastly, I am having him maintain his benazepril, simvastatin, atenolol, Insulin Pen Needle, ONETOUCH VERIO FLEX SYSTEM, glucose blood, amLODipine, Exenatide ER, Na Sulfate-K Sulfate-Mg  Sulf, naproxen, temazepam, and metFORMIN.  Meds ordered this encounter  Medications  . temazepam (RESTORIL) 30 MG capsule    Sig: TAKE 1 CAPSULE BY MOUTH EVERY DAY AT BEDTIME AS NEEDED FOR SLEEP    Dispense:  30 capsule    Refill:  5  . Insulin Glargine (TOUJEO SOLOSTAR) 300 UNIT/ML SOPN    Sig: Inject 60 Units into the skin daily.    Dispense:  1.5 mL    Refill:  11  . metFORMIN (GLUCOPHAGE) 1000 MG tablet    Sig: TAKE 1 TABLET BY MOUTH TWICE DAILY WITH A MEAL    Dispense:  180 tablet    Refill:  1  . empagliflozin (JARDIANCE) 10 MG TABS tablet    Sig: Take 10 mg by mouth daily.    Dispense:  90 tablet    Refill:  1     Follow-up: Return in about 4 months (around 05/05/2017).  Thomas Jones, MD 

## 2017-01-05 NOTE — Patient Instructions (Signed)

## 2017-01-05 NOTE — Telephone Encounter (Signed)
(  Key: [id number]) PA submitted.

## 2017-01-18 NOTE — Telephone Encounter (Signed)
PA has been approved

## 2017-01-20 ENCOUNTER — Ambulatory Visit (AMBULATORY_SURGERY_CENTER): Payer: Commercial Managed Care - HMO | Admitting: Internal Medicine

## 2017-01-20 ENCOUNTER — Encounter: Payer: Self-pay | Admitting: Internal Medicine

## 2017-01-20 VITALS — BP 144/85 | HR 72 | Temp 97.8°F | Resp 15

## 2017-01-20 DIAGNOSIS — D125 Benign neoplasm of sigmoid colon: Secondary | ICD-10-CM

## 2017-01-20 DIAGNOSIS — D120 Benign neoplasm of cecum: Secondary | ICD-10-CM

## 2017-01-20 DIAGNOSIS — K635 Polyp of colon: Secondary | ICD-10-CM

## 2017-01-20 DIAGNOSIS — Z86010 Personal history of colonic polyps: Secondary | ICD-10-CM | POA: Diagnosis not present

## 2017-01-20 MED ORDER — SODIUM CHLORIDE 0.9 % IV SOLN: 500.0000 mL | INTRAVENOUS | Status: DC

## 2017-01-20 NOTE — Progress Notes (Signed)
Pt's states no medical or surgical changes since previsit or office visit. 

## 2017-01-20 NOTE — Op Note (Signed)
[facility] Patient Name: Patrick Parker Procedure Date: 01/20/2017 3:13 PM MRN: [id number] Endoscopist: Jerene Bears , MD Age: 61 Referring MD:  Date of Birth: 17-Apr-1956 Gender: Male Account #: [id number] Procedure:                Colonoscopy Indications:              Surveillance: Personal history of adenomatous                            polyps on last colonoscopy 5 years ago Medicines:                Monitored Anesthesia Care Procedure:                Pre-Anesthesia Assessment:                           - Prior to the procedure, a History and Physical                            was performed, and patient medications and                            allergies were reviewed. The patient's tolerance of                            previous anesthesia was also reviewed. The risks                            and benefits of the procedure and the sedation                            options and risks were discussed with the patient.                            All questions were answered, and informed consent                            was obtained. Prior Anticoagulants: The patient has                            taken no previous anticoagulant or antiplatelet                            agents. ASA Grade Assessment: II - A patient with                            mild systemic disease. After reviewing the risks                            and benefits, the patient was deemed in                            satisfactory condition to undergo the procedure.  After obtaining informed consent, the colonoscope                            was passed under direct vision. Throughout the                            procedure, the patient's blood pressure, pulse, and                            oxygen saturations were monitored continuously. The                            Model CF-HQ190L [id number]) scope was introduced                            through the anus and advanced  to the the cecum,                            identified by appendiceal orifice and ileocecal                            valve. The colonoscopy was performed without                            difficulty. The patient tolerated the procedure                            well. The quality of the bowel preparation was                            excellent. The ileocecal valve, appendiceal                            orifice, and rectum were photographed. Scope In: 3:24:32 PM Scope Out: 3:36:28 PM Scope Withdrawal Time: 0 hours 9 minutes 50 seconds  Total Procedure Duration: 0 hours 11 minutes 56 seconds  Findings:                 The digital rectal exam was normal.                           A 3 mm polyp was found in the cecum. The polyp was                            sessile. The polyp was removed with a cold biopsy                            forceps. Resection and retrieval were complete.                           A 3 mm polyp was found in the sigmoid colon. The                            polyp was sessile.  The polyp was removed with a                            cold biopsy forceps. Resection and retrieval were                            complete.                           A few small-mouthed diverticula were found in the                            hepatic flexure and ascending colon.                           The exam was otherwise without abnormality on                            direct and retroflexion views. Complications:            No immediate complications. Estimated Blood Loss:     Estimated blood loss was minimal. Impression:               - One 3 mm polyp in the cecum, removed with a cold                            biopsy forceps. Resected and retrieved.                           - One 3 mm polyp in the sigmoid colon, removed with                            a cold biopsy forceps. Resected and retrieved.                           - Mild diverticulosis at the hepatic flexure and in                             the ascending colon.                           - The examination was otherwise normal on direct                            and retroflexion views. Recommendation:           - Patient has a contact number available for                            emergencies. The signs and symptoms of potential                            delayed complications were discussed with the                            patient. Return  to normal activities tomorrow.                            Written discharge instructions were provided to the                            patient.                           - Resume previous diet.                           - Continue present medications.                           - Await pathology results.                           - Repeat colonoscopy is recommended for                            surveillance. The colonoscopy date will be                            determined after pathology results from today's                            exam become available for review. Jerene Bears, MD 01/20/2017 3:39:24 PM This report has been signed electronically.

## 2017-01-20 NOTE — Progress Notes (Signed)
Called to room to assist during endoscopic procedure.  Patient ID and intended procedure confirmed with present staff. Received instructions for my participation in the procedure from the performing physician.  

## 2017-01-20 NOTE — Patient Instructions (Signed)
YOU HAD AN ENDOSCOPIC PROCEDURE TODAY AT [facility]:   Refer to the procedure report that was given to you for any specific questions about what was found during the examination.  If the procedure report does not answer your questions, please call your gastroenterologist to clarify.  If you requested that your care partner not be given the details of your procedure findings, then the procedure report has been included in a sealed envelope for you to review at your convenience later.  YOU SHOULD EXPECT: Some feelings of bloating in the abdomen. Passage of more gas than usual.  Walking can help get rid of the air that was put into your GI tract during the procedure and reduce the bloating. If you had a lower endoscopy (such as a colonoscopy or flexible sigmoidoscopy) you may notice spotting of blood in your stool or on the toilet paper. If you underwent a bowel prep for your procedure, you may not have a normal bowel movement for a few days.  Please Note:  You might notice some irritation and congestion in your nose or some drainage.  This is from the oxygen used during your procedure.  There is no need for concern and it should clear up in a day or so.  SYMPTOMS TO REPORT IMMEDIATELY:   Following lower endoscopy (colonoscopy or flexible sigmoidoscopy):  Excessive amounts of blood in the stool  Significant tenderness or worsening of abdominal pains  Swelling of the abdomen that is new, acute  Fever of 100F or higher   For urgent or emergent issues, a gastroenterologist can be reached at any hour by calling [phone].   DIET:  We do recommend a small meal at first, but then you may proceed to your regular diet.  Drink plenty of fluids but you should avoid alcoholic beverages for 24 hours.  ACTIVITY:  You should plan to take it easy for the rest of today and you should NOT DRIVE or use heavy machinery until tomorrow (because of the sedation medicines used during the test).     FOLLOW UP: Our staff will call the number listed on your records the next business day following your procedure to check on you and address any questions or concerns that you may have regarding the information given to you following your procedure. If we do not reach you, we will leave a message.  However, if you are feeling well and you are not experiencing any problems, there is no need to return our call.  We will assume that you have returned to your regular daily activities without incident.  If any biopsies were taken you will be contacted by phone or by letter within the next 1-3 weeks.  Please call us at [phone] if you have not heard about the biopsies in 3 weeks.   Await for biopsy results to determined next repeat Colonoscopy for screening Diverticulosis (handout given) Polyps (handout given)    SIGNATURES/CONFIDENTIALITY: You and/or your care partner have signed paperwork which will be entered into your electronic medical record.  These signatures attest to the fact that that the information above on your After Visit Summary has been reviewed and is understood.  Full responsibility of the confidentiality of this discharge information lies with you and/or your care-partner.

## 2017-01-20 NOTE — Progress Notes (Signed)
To recovery, report to Jones, RN, VSS 

## 2017-01-21 ENCOUNTER — Telehealth: Payer: Self-pay

## 2017-01-21 LAB — HM COLONOSCOPY

## 2017-01-21 NOTE — Telephone Encounter (Signed)
  Follow up Call-  Call back number 01/20/2017  Post procedure Call Back phone  # [phone]  Permission to leave phone message Yes  Some recent data might be hidden     Patient questions:  Do you have a fever, pain , or abdominal swelling? No. Pain Score  0 *  Have you tolerated food without any problems? Yes.    Have you been able to return to your normal activities? Yes.    Do you have any questions about your discharge instructions: Diet   No. Medications  No. Follow up visit  No.  Do you have questions or concerns about your Care? No.  Actions: * If pain score is 4 or above: No action needed, pain <4.

## 2017-01-26 ENCOUNTER — Other Ambulatory Visit: Payer: Self-pay | Admitting: Internal Medicine

## 2017-01-26 ENCOUNTER — Encounter: Payer: Self-pay | Admitting: Internal Medicine

## 2017-01-29 ENCOUNTER — Other Ambulatory Visit: Payer: Self-pay | Admitting: Internal Medicine

## 2017-03-01 ENCOUNTER — Other Ambulatory Visit: Payer: Self-pay | Admitting: Internal Medicine

## 2017-05-02 ENCOUNTER — Other Ambulatory Visit: Payer: Self-pay | Admitting: Internal Medicine

## 2017-07-03 ENCOUNTER — Other Ambulatory Visit: Payer: Self-pay | Admitting: Internal Medicine

## 2017-07-03 DIAGNOSIS — F5101 Primary insomnia: Secondary | ICD-10-CM

## 2017-07-06 ENCOUNTER — Ambulatory Visit (INDEPENDENT_AMBULATORY_CARE_PROVIDER_SITE_OTHER): Payer: 59 | Admitting: Internal Medicine

## 2017-07-06 ENCOUNTER — Encounter: Payer: Self-pay | Admitting: Internal Medicine

## 2017-07-06 ENCOUNTER — Other Ambulatory Visit (INDEPENDENT_AMBULATORY_CARE_PROVIDER_SITE_OTHER): Payer: 59

## 2017-07-06 VITALS — BP 140/90 | HR 71 | Temp 98.0°F | Resp 16 | Ht 69.0 in | Wt 220.0 lb

## 2017-07-06 DIAGNOSIS — I10 Essential (primary) hypertension: Secondary | ICD-10-CM | POA: Diagnosis not present

## 2017-07-06 DIAGNOSIS — Z23 Encounter for immunization: Secondary | ICD-10-CM

## 2017-07-06 DIAGNOSIS — E785 Hyperlipidemia, unspecified: Secondary | ICD-10-CM

## 2017-07-06 DIAGNOSIS — E118 Type 2 diabetes mellitus with unspecified complications: Secondary | ICD-10-CM

## 2017-07-06 DIAGNOSIS — F5101 Primary insomnia: Secondary | ICD-10-CM | POA: Diagnosis not present

## 2017-07-06 DIAGNOSIS — Z794 Long term (current) use of insulin: Secondary | ICD-10-CM

## 2017-07-06 LAB — URINALYSIS, ROUTINE W REFLEX MICROSCOPIC
Bilirubin Urine: NEGATIVE
Hgb urine dipstick: NEGATIVE
Ketones, ur: NEGATIVE
Leukocytes, UA: NEGATIVE
Nitrite: NEGATIVE
RBC / HPF: NONE SEEN (ref 0–?)
Specific Gravity, Urine: 1.02 (ref 1.000–1.030)
Total Protein, Urine: NEGATIVE
Urine Glucose: >=1000 — AB
Urobilinogen, UA: 0.2 (ref 0.0–1.0)
WBC, UA: NONE SEEN (ref 0–?)
pH: 6 (ref 5.0–8.0)

## 2017-07-06 LAB — LIPID PANEL
Cholesterol: 169 mg/dL (ref 0–200)
HDL: 39.1 mg/dL (ref >39.00)
LDL Cholesterol: 100 mg/dL — ABNORMAL HIGH (ref 0–99)
NonHDL: 129.87
Total CHOL/HDL Ratio: 4
Triglycerides: 148 mg/dL (ref 0.0–149.0)
VLDL: 29.6 mg/dL (ref 0.0–40.0)

## 2017-07-06 LAB — MICROALBUMIN / CREATININE URINE RATIO
Creatinine,U: 117.8 mg/dL
Microalb Creat Ratio: 1.3 mg/g (ref 0.0–30.0)
Microalb, Ur: 1.5 mg/dL (ref 0.0–1.9)

## 2017-07-06 LAB — CBC WITH DIFFERENTIAL/PLATELET
Basophils Absolute: 0 10*3/uL (ref 0.0–0.1)
Basophils Relative: 0.7 % (ref 0.0–3.0)
Eosinophils Absolute: 0.2 10*3/uL (ref 0.0–0.7)
Eosinophils Relative: 2.3 % (ref 0.0–5.0)
HCT: 48.6 % (ref 39.0–52.0)
Hemoglobin: 16.5 g/dL (ref 13.0–17.0)
Lymphocytes Relative: 23.8 % (ref 12.0–46.0)
Lymphs Abs: 1.8 10*3/uL (ref 0.7–4.0)
MCHC: 33.9 g/dL (ref 30.0–36.0)
MCV: 92.7 fl (ref 78.0–100.0)
Monocytes Absolute: 0.7 10*3/uL (ref 0.1–1.0)
Monocytes Relative: 9.5 % (ref 3.0–12.0)
Neutro Abs: 4.7 10*3/uL (ref 1.4–7.7)
Neutrophils Relative %: 63.7 % (ref 43.0–77.0)
Platelets: 325 10*3/uL (ref 150.0–400.0)
RBC: 5.24 Mil/uL (ref 4.22–5.81)
RDW: 13.8 % (ref 11.5–15.5)
WBC: 7.4 10*3/uL (ref 4.0–10.5)

## 2017-07-06 LAB — COMPREHENSIVE METABOLIC PANEL
ALT: 28 U/L (ref 0–53)
AST: 17 U/L (ref 0–37)
Albumin: 4.4 g/dL (ref 3.5–5.2)
Alkaline Phosphatase: 60 U/L (ref 39–117)
BUN: 19 mg/dL (ref 6–23)
CO2: 31 mEq/L (ref 19–32)
Calcium: 9.9 mg/dL (ref 8.4–10.5)
Chloride: 105 mEq/L (ref 96–112)
Creatinine, Ser: 1.01 mg/dL (ref 0.40–1.50)
GFR: 79.78 mL/min (ref >60.00)
Glucose, Bld: 117 mg/dL — ABNORMAL HIGH (ref 70–99)
Potassium: 3.9 mEq/L (ref 3.5–5.1)
Sodium: 140 mEq/L (ref 135–145)
Total Bilirubin: 0.4 mg/dL (ref 0.2–1.2)
Total Protein: 7.4 g/dL (ref 6.0–8.3)

## 2017-07-06 MED ORDER — TEMAZEPAM 30 MG PO CAPS: ORAL_CAPSULE | ORAL | 5 refills | Status: DC

## 2017-07-06 NOTE — Progress Notes (Signed)
Subjective:  Patient ID: Patrick Parker, male    DOB: 06-May-1956  Age: 61 y.o. MRN: [id number]  CC: Hypertension; Diabetes; and Hyperlipidemia   HPI Diamond Martucci presents for f/up - He complains of persistent insomnia with difficulty falling asleep and wants a refill on temazepam. He has been working on his lifestyle modifications and says his blood sugars have come down quite a bit. He's had no recent episodes of CP, DOE, shortness of breath, palpitations, edema, or fatigue.  Outpatient Medications Prior to Visit  Medication Sig Dispense Refill  . amLODipine (NORVASC) 10 MG tablet TAKE 1 TABLET BY MOUTH DAILY 90 tablet 1  . atenolol (TENORMIN) 25 MG tablet TAKE 2 TABLETS(50 MG TOTAL) BY MOUTH DAILY 180 tablet 1  . benazepril (LOTENSIN) 20 MG tablet TAKE 1 TABLET(20 MG) BY MOUTH DAILY 90 tablet 1  . Blood Glucose Monitoring Suppl (ONETOUCH VERIO FLEX SYSTEM) w/Device KIT 1 Act by Does not apply route 3 (three) times daily. 2 kit 0  . empagliflozin (JARDIANCE) 10 MG TABS tablet Take 10 mg by mouth daily. 90 tablet 1  . Exenatide ER (BYDUREON BCISE) 2 MG/0.85ML AUIJ Inject 1 Act into the skin once a week. 4 pen 11  . glucose blood (ONETOUCH VERIO) test strip Use TID 100 each 12  . Insulin Glargine (TOUJEO SOLOSTAR) 300 UNIT/ML SOPN Inject 60 Units into the skin daily. 1.5 mL 11  . Insulin Pen Needle 31G X 5 MM MISC 1 Act by Does not apply route daily. 100 each 3  . metFORMIN (GLUCOPHAGE) 1000 MG tablet TAKE 1 TABLET BY MOUTH TWICE DAILY WITH A MEAL 180 tablet 1  . Na Sulfate-K Sulfate-Mg Sulf 17.5-3.13-1.6 GM/180ML SOLN Suprep (no substitutions)-TAKE AS DIRECTED. 354 mL 0  . naproxen (NAPROSYN) 500 MG tablet Take 1 tablet (500 mg total) by mouth 2 (two) times daily. 30 tablet 0  . simvastatin (ZOCOR) 20 MG tablet TAKE 1 TABLET BY MOUTH EVERY EVENING 90 tablet 1  . atenolol (TENORMIN) 50 MG tablet TAKE 1 TABLET BY MOUTH EVERY DAY 90 tablet 2  . temazepam (RESTORIL) 30 MG capsule TAKE 1 CAPSULE  BY MOUTH EVERY DAY AT BEDTIME AS NEEDED FOR SLEEP 30 capsule 5  . 0.9 %  sodium chloride infusion      No facility-administered medications prior to visit.     ROS Review of Systems  Constitutional: Negative.  Negative for diaphoresis, fatigue and unexpected weight change.  HENT: Negative.   Eyes: Negative.  Negative for visual disturbance.  Respiratory: Negative.  Negative for cough, chest tightness, shortness of breath and wheezing.   Cardiovascular: Negative for chest pain, palpitations and leg swelling.  Gastrointestinal: Negative for abdominal pain, constipation, diarrhea, nausea and vomiting.  Endocrine: Negative.  Negative for polydipsia, polyphagia and polyuria.  Genitourinary: Negative.  Negative for difficulty urinating, dysuria, hematuria and urgency.  Musculoskeletal: Negative.  Negative for back pain and myalgias.  Skin: Negative.  Negative for color change and rash.  Allergic/Immunologic: Negative.   Neurological: Negative.  Negative for dizziness, weakness and light-headedness.  Hematological: Negative.  Negative for adenopathy. Does not bruise/bleed easily.  Psychiatric/Behavioral: Positive for sleep disturbance. Negative for confusion, decreased concentration, dysphoric mood and suicidal ideas. The patient is not nervous/anxious.     Objective:  BP 140/90 (BP Location: Left Arm, Patient Position: Sitting, Cuff Size: Normal)   Pulse 71   Temp 98 F (36.7 C) (Oral)   Resp 16   Ht _0  (1.753 m)   Wt 220 lb (  99.8 kg)   SpO2 98%   BMI 32.49 kg/m   BP Readings from Last 3 Encounters:  07/06/17 140/90  01/20/17 (!) 144/85  01/05/17 (!) 142/90    Wt Readings from Last 3 Encounters:  07/06/17 220 lb (99.8 kg)  01/05/17 226 lb 12 oz (102.9 kg)  12/20/16 228 lb (103.4 kg)    Physical Exam  Constitutional: He is oriented to person, place, and time. No distress.  HENT:  Mouth/Throat: Oropharynx is clear and moist. No oropharyngeal exudate.  Eyes: Conjunctivae  are normal. Right eye exhibits no discharge. Left eye exhibits no discharge. No scleral icterus.  Neck: Normal range of motion. Neck supple. No JVD present. No thyromegaly present.  Cardiovascular: Normal rate, regular rhythm and intact distal pulses.  Exam reveals no gallop and no friction rub.   No murmur heard. Pulmonary/Chest: Effort normal and breath sounds normal. No respiratory distress. He has no wheezes. He has no rales. He exhibits no tenderness.  Abdominal: Soft. Bowel sounds are normal. He exhibits no distension and no mass. There is no tenderness. There is no rebound and no guarding.  Musculoskeletal: Normal range of motion. He exhibits no edema or tenderness.  Lymphadenopathy:    He has no cervical adenopathy.  Neurological: He is alert and oriented to person, place, and time.  Skin: Skin is warm and dry. No rash noted. He is not diaphoretic. No erythema. No pallor.  Psychiatric: He has a normal mood and affect. His behavior is normal. Judgment and thought content normal.  Vitals reviewed.   Lab Results  Component Value Date   WBC 7.4 07/06/2017   HGB 16.5 07/06/2017   HCT 48.6 07/06/2017   PLT 325.0 07/06/2017   GLUCOSE 117 (H) 07/06/2017   CHOL 169 07/06/2017   TRIG 148.0 07/06/2017   HDL 39.10 07/06/2017   LDLDIRECT 91.0 07/21/2016   LDLCALC 100 (H) 07/06/2017   ALT 28 07/06/2017   AST 17 07/06/2017   NA 140 07/06/2017   K 3.9 07/06/2017   CL 105 07/06/2017   CREATININE 1.01 07/06/2017   BUN 19 07/06/2017   CO2 31 07/06/2017   TSH 2.20 07/21/2016   PSA 0.92 07/21/2016   HGBA1C 7.0 07/07/2017   MICROALBUR 1.5 07/06/2017    Dg Chest 2 View  Result Date: 01/04/2017 CLINICAL DATA:  Chest pain radiating to shoulder for several days. EXAM: CHEST  2 VIEW COMPARISON:  01/01/1998 CXR report FINDINGS: The heart size and mediastinal contours are within normal limits. Both lungs are clear. Minimal degenerative change along the dorsal spine. No acute osseous abnormality.  IMPRESSION: No active cardiopulmonary disease. Electronically Signed   By: Ashley Royalty M.D.   On: 01/04/2017 18:07    Assessment & Plan:   Josiah was seen today for hypertension, diabetes and hyperlipidemia.  Diagnoses and all orders for this visit:  Essential hypertension- his blood pressure is well-controlled, electrolytes and renal function are normal. -     Comprehensive metabolic panel; Future -     CBC with Differential/Platelet; Future -     Urinalysis, Routine w reflex microscopic; Future  Hyperlipidemia with target LDL less than 100- he has achieved his LDL goal is doing well on the statin. -     Lipid panel; Future  Type 2 diabetes mellitus with complication, with long-term current use of insulin ([place])- his A1c is down to 7%, his blood sugars are adequately well controlled. -     Comprehensive metabolic panel; Future -  Microalbumin / creatinine urine ratio; Future -     POCT glycosylated hemoglobin (Hb A1C)  Primary insomnia -     temazepam (RESTORIL) 30 MG capsule; TAKE 1 CAPSULE BY MOUTH EVERY DAY AT BEDTIME AS NEEDED FOR SLEEP  Need for vaccination for Strep pneumoniae -     Pneumococcal polysaccharide vaccine 23-valent greater than or equal to 2yo subcutaneous/IM   I am having Mr. Wholey maintain his Insulin Pen Needle, ONETOUCH VERIO FLEX SYSTEM, glucose blood, Exenatide ER, Na Sulfate-K Sulfate-Mg Sulf, naproxen, Insulin Glargine, metFORMIN, empagliflozin, amLODipine, simvastatin, benazepril, atenolol, and temazepam. We will stop administering sodium chloride.  Meds ordered this encounter  Medications  . temazepam (RESTORIL) 30 MG capsule    Sig: TAKE 1 CAPSULE BY MOUTH EVERY DAY AT BEDTIME AS NEEDED FOR SLEEP    Dispense:  30 capsule    Refill:  5     Follow-up: Return in about 6 months (around 01/06/2018).  Scarlette Calico, MD

## 2017-07-06 NOTE — Patient Instructions (Signed)

## 2017-07-07 LAB — POCT GLYCOSYLATED HEMOGLOBIN (HGB A1C): Hemoglobin A1C: 7

## 2017-07-08 ENCOUNTER — Encounter: Payer: Self-pay | Admitting: Internal Medicine

## 2017-07-22 ENCOUNTER — Other Ambulatory Visit: Payer: Self-pay | Admitting: Internal Medicine

## 2017-07-22 DIAGNOSIS — E118 Type 2 diabetes mellitus with unspecified complications: Secondary | ICD-10-CM

## 2017-07-22 DIAGNOSIS — Z794 Long term (current) use of insulin: Secondary | ICD-10-CM

## 2017-07-30 ENCOUNTER — Other Ambulatory Visit: Payer: Self-pay | Admitting: Internal Medicine

## 2017-09-01 ENCOUNTER — Other Ambulatory Visit: Payer: Self-pay | Admitting: Internal Medicine

## 2017-10-29 ENCOUNTER — Other Ambulatory Visit: Payer: Self-pay | Admitting: Internal Medicine

## 2017-11-01 ENCOUNTER — Other Ambulatory Visit: Payer: Self-pay | Admitting: Internal Medicine

## 2017-11-01 DIAGNOSIS — Z794 Long term (current) use of insulin: Secondary | ICD-10-CM

## 2017-11-01 DIAGNOSIS — E118 Type 2 diabetes mellitus with unspecified complications: Secondary | ICD-10-CM

## 2018-01-03 ENCOUNTER — Other Ambulatory Visit: Payer: Self-pay | Admitting: Internal Medicine

## 2018-01-03 DIAGNOSIS — E118 Type 2 diabetes mellitus with unspecified complications: Secondary | ICD-10-CM

## 2018-01-03 DIAGNOSIS — F5101 Primary insomnia: Secondary | ICD-10-CM

## 2018-01-04 ENCOUNTER — Telehealth: Payer: Self-pay | Admitting: Internal Medicine

## 2018-01-04 DIAGNOSIS — Z794 Long term (current) use of insulin: Secondary | ICD-10-CM

## 2018-01-04 DIAGNOSIS — E118 Type 2 diabetes mellitus with unspecified complications: Secondary | ICD-10-CM

## 2018-01-04 NOTE — Telephone Encounter (Signed)
This medication refill request was denied recently due to needing an appointment. Routing to PCP for further consideration.

## 2018-01-04 NOTE — Telephone Encounter (Signed)
Copied from [place]. Topic: General - Other >> Jan 04, 2018  1:36 PM Cecelia Byars, NT wrote: Reason for CRM: Patient called and would like to know if the following prescriptions can refilled  until his appointment on 01/18/18 at 1:00 PM                                   1. Temazepam  30 mg                                  2. Insulin glargine 300 units                                     3. Insulin pen 31 g X mm please send to [facility]  [phone]

## 2018-01-05 ENCOUNTER — Other Ambulatory Visit: Payer: Self-pay | Admitting: Internal Medicine

## 2018-01-05 DIAGNOSIS — F5101 Primary insomnia: Secondary | ICD-10-CM

## 2018-01-05 MED ORDER — INSULIN PEN NEEDLE 31G X 5 MM MISC: 1.0000 | Freq: Every day | 0 refills | Status: AC

## 2018-01-05 MED ORDER — TEMAZEPAM 30 MG PO CAPS: ORAL_CAPSULE | ORAL | 0 refills | Status: DC

## 2018-01-05 MED ORDER — INSULIN GLARGINE 300 UNIT/ML ~~LOC~~ SOPN: 60.0000 [IU] | PEN_INJECTOR | Freq: Every day | SUBCUTANEOUS | 0 refills | Status: DC

## 2018-01-05 NOTE — Telephone Encounter (Signed)
Pt states he is going out of town tomorrow and he has made apt for the 20th,  He wants to know if he can get his meds filled before the apt because he is out of meds.

## 2018-01-05 NOTE — Telephone Encounter (Signed)
Pt is requesting refill of temazepam. LOV 07/06/2017. Pt has appt scheduled for 01/18/2018

## 2018-01-18 ENCOUNTER — Encounter: Payer: Self-pay | Admitting: Internal Medicine

## 2018-01-18 ENCOUNTER — Ambulatory Visit: Payer: 59 | Admitting: Internal Medicine

## 2018-01-18 VITALS — BP 128/88 | HR 85 | Temp 98.6°F | Ht 69.0 in | Wt 235.0 lb

## 2018-01-18 DIAGNOSIS — Z23 Encounter for immunization: Secondary | ICD-10-CM | POA: Diagnosis not present

## 2018-01-18 DIAGNOSIS — Z794 Long term (current) use of insulin: Secondary | ICD-10-CM

## 2018-01-18 DIAGNOSIS — E118 Type 2 diabetes mellitus with unspecified complications: Secondary | ICD-10-CM

## 2018-01-18 DIAGNOSIS — I10 Essential (primary) hypertension: Secondary | ICD-10-CM

## 2018-01-18 LAB — POCT GLUCOSE (DEVICE FOR HOME USE): POC Glucose: 251 mg/dl — AB (ref 70–99)

## 2018-01-18 LAB — POCT GLYCOSYLATED HEMOGLOBIN (HGB A1C): Hemoglobin A1C: 8.1

## 2018-01-18 MED ORDER — EXENATIDE ER 2 MG/0.85ML ~~LOC~~ AUIJ: 1.0000 | AUTO-INJECTOR | SUBCUTANEOUS | 3 refills | Status: DC

## 2018-01-18 MED ORDER — EMPAGLIFLOZIN 10 MG PO TABS: 10.0000 mg | ORAL_TABLET | Freq: Every day | ORAL | 1 refills | Status: DC

## 2018-01-18 MED ORDER — METFORMIN HCL 1000 MG PO TABS: ORAL_TABLET | ORAL | 1 refills | Status: AC

## 2018-01-18 MED ORDER — INSULIN GLARGINE 300 UNIT/ML ~~LOC~~ SOPN: 60.0000 [IU] | PEN_INJECTOR | Freq: Every day | SUBCUTANEOUS | 1 refills | Status: DC

## 2018-01-18 NOTE — Patient Instructions (Signed)

## 2018-01-18 NOTE — Progress Notes (Signed)
Subjective:  Patient ID: Patrick Parker, male    DOB: 08-28-1956  Age: 62 y.o. MRN: [id number]  CC: Hypertension and Diabetes   HPI Patrick Parker presents for f/up - He complains that his blood sugars have not been well controlled.  Unfortunately, h has gained 15 pounds since his last visit.  He is not adhering to any lifestyle modifications.  He says he thinks he is taking all of his oral medications as well as the injectable basal insulin and GLP-1 agonist but he is not really sure about any of it.  He denies any recent episodes of headache, blurred vision, chest pain, shortness of breath, polyuria, polydipsia, or polyphagia.  Outpatient Medications Prior to Visit  Medication Sig Dispense Refill  . amLODipine (NORVASC) 10 MG tablet TAKE 1 TABLET BY MOUTH DAILY 90 tablet 1  . benazepril (LOTENSIN) 20 MG tablet Take 1 tablet (20 mg total) by mouth daily. 90 tablet 1  . Blood Glucose Monitoring Suppl (ONETOUCH VERIO FLEX SYSTEM) w/Device KIT 1 Act by Does not apply route 3 (three) times daily. 2 kit 0  . glucose blood (ONETOUCH VERIO) test strip Use TID 100 each 12  . Insulin Pen Needle (B-D UF III MINI PEN NEEDLES) 31G X 5 MM MISC Inject 1 Act into the skin daily. 100 each 0  . simvastatin (ZOCOR) 20 MG tablet TAKE 1 TABLET BY MOUTH EVERY EVENING 90 tablet 1  . temazepam (RESTORIL) 30 MG capsule TAKE 1 CAPSULE BY MOUTH EVERY DAY AT BEDTIME AS NEEDED FOR SLEEP 30 capsule 0  . atenolol (TENORMIN) 25 MG tablet TAKE 2 TABLETS(50 MG) BY MOUTH DAILY 180 tablet 0  . Exenatide ER (BYDUREON BCISE) 2 MG/0.85ML AUIJ Inject 1 Act into the skin once a week. 4 pen 11  . Insulin Glargine (TOUJEO SOLOSTAR) 300 UNIT/ML SOPN Inject 60 Units into the skin daily. 5 pen 0  . JARDIANCE 10 MG TABS tablet TAKE 1 TABLET BY MOUTH DAILY 90 tablet 1  . metFORMIN (GLUCOPHAGE) 1000 MG tablet TAKE 1 TABLET BY MOUTH TWICE DAILY WITH A MEAL 180 tablet 1  . Na Sulfate-K Sulfate-Mg Sulf 17.5-3.13-1.6 GM/180ML SOLN Suprep (no  substitutions)-TAKE AS DIRECTED. 354 mL 0  . naproxen (NAPROSYN) 500 MG tablet Take 1 tablet (500 mg total) by mouth 2 (two) times daily. 30 tablet 0   No facility-administered medications prior to visit.     ROS Review of Systems  Constitutional: Positive for fatigue. Negative for appetite change, diaphoresis and unexpected weight change.  HENT: Negative.   Eyes: Negative for visual disturbance.  Respiratory: Negative for cough, chest tightness, shortness of breath and wheezing.   Cardiovascular: Negative for chest pain, palpitations and leg swelling.  Gastrointestinal: Negative for abdominal pain, constipation, diarrhea, nausea and vomiting.  Endocrine: Negative for polydipsia, polyphagia and polyuria.  Genitourinary: Negative for decreased urine volume, difficulty urinating, dysuria, hematuria and urgency.  Musculoskeletal: Negative.  Negative for arthralgias, back pain and myalgias.  Skin: Negative.   Allergic/Immunologic: Negative.   Neurological: Negative.  Negative for dizziness, weakness, light-headedness and headaches.  Hematological: Negative for adenopathy. Does not bruise/bleed easily.  Psychiatric/Behavioral: Negative.     Objective:  BP 128/88 (BP Location: Left Arm, Patient Position: Sitting, Cuff Size: Large)   Pulse 85   Temp 98.6 F (37 C) (Oral)   Ht '5\' 9"'$  (1.753 m)   Wt 235 lb (106.6 kg)   SpO2 97%   BMI 34.70 kg/m   BP Readings from Last 3 Encounters:  01/18/18  128/88  07/06/17 140/90  01/20/17 (!) 144/85    Wt Readings from Last 3 Encounters:  01/18/18 235 lb (106.6 kg)  07/06/17 220 lb (99.8 kg)  01/05/17 226 lb 12 oz (102.9 kg)    Physical Exam  Constitutional: He is oriented to person, place, and time. No distress.  HENT:  Mouth/Throat: Oropharynx is clear and moist. No oropharyngeal exudate.  Eyes: Conjunctivae are normal. Left eye exhibits no discharge. No scleral icterus.  Neck: Normal range of motion. Neck supple. No JVD present. No  thyromegaly present.  Cardiovascular: Normal rate, regular rhythm and normal heart sounds. Exam reveals no gallop.  No murmur heard. Pulmonary/Chest: Effort normal and breath sounds normal. No respiratory distress. He has no wheezes. He has no rales.  Abdominal: Soft. Bowel sounds are normal. He exhibits no distension and no mass. There is no tenderness. There is no guarding.  Musculoskeletal: Normal range of motion. He exhibits no edema, tenderness or deformity.  Lymphadenopathy:    He has no cervical adenopathy.  Neurological: He is oriented to person, place, and time. No cranial nerve deficit. Coordination normal.  Skin: Skin is warm and dry. No rash noted. He is not diaphoretic. No erythema. No pallor.  Vitals reviewed.   Lab Results  Component Value Date   WBC 7.4 07/06/2017   HGB 16.5 07/06/2017   HCT 48.6 07/06/2017   PLT 325.0 07/06/2017   GLUCOSE 117 (H) 07/06/2017   CHOL 169 07/06/2017   TRIG 148.0 07/06/2017   HDL 39.10 07/06/2017   LDLDIRECT 91.0 07/21/2016   LDLCALC 100 (H) 07/06/2017   ALT 28 07/06/2017   AST 17 07/06/2017   NA 140 07/06/2017   K 3.9 07/06/2017   CL 105 07/06/2017   CREATININE 1.01 07/06/2017   BUN 19 07/06/2017   CO2 31 07/06/2017   TSH 2.20 07/21/2016   PSA 0.92 07/21/2016   HGBA1C 8.1 01/18/2018   MICROALBUR 1.5 07/06/2017    Dg Chest 2 View  Result Date: 01/04/2017 CLINICAL DATA:  Chest pain radiating to shoulder for several days. EXAM: CHEST  2 VIEW COMPARISON:  01/01/1998 CXR report FINDINGS: The heart size and mediastinal contours are within normal limits. Both lungs are clear. Minimal degenerative change along the dorsal spine. No acute osseous abnormality. IMPRESSION: No active cardiopulmonary disease. Electronically Signed   By: Ashley Royalty M.D.   On: 01/04/2017 18:07    Assessment & Plan:   Patrick Parker was seen today for hypertension and diabetes.  Diagnoses and all orders for this visit:  Type 2 diabetes mellitus with complication,  without long-term current use of insulin ([facility]) -     POCT glycosylated hemoglobin (Hb A1C) -     POCT Glucose (Device for Home Use) -     Exenatide ER (BYDUREON BCISE) 2 MG/0.85ML AUIJ; Inject 1 Act into the skin once a week. -     Insulin Glargine (TOUJEO SOLOSTAR) 300 UNIT/ML SOPN; Inject 60 Units into the skin daily. -     empagliflozin (JARDIANCE) 10 MG TABS tablet; Take 10 mg by mouth daily. -     Basic metabolic panel; Future -     Ambulatory referral to Ophthalmology  Essential hypertension- His blood pressure is well controlled.  Electrolytes and renal function are normal. -     Basic metabolic panel; Future  Need for influenza vaccination -     Flu Vaccine QUAD 36+ mos IM  Type 2 diabetes mellitus with complication, with long-term current use of insulin ([place])- His  A1c is up to 8.1%.  His blood sugars are not adequately well controlled.  I encouraged him to be compliant with his medical regimen as well as his lifestyle modifications. -     metFORMIN (GLUCOPHAGE) 1000 MG tablet; TAKE 1 TABLET BY MOUTH TWICE DAILY WITH A MEAL -     Exenatide ER (BYDUREON BCISE) 2 MG/0.85ML AUIJ; Inject 1 Act into the skin once a week. -     Insulin Glargine (TOUJEO SOLOSTAR) 300 UNIT/ML SOPN; Inject 60 Units into the skin daily. -     empagliflozin (JARDIANCE) 10 MG TABS tablet; Take 10 mg by mouth daily. -     Basic metabolic panel; Future -     Ambulatory referral to Ophthalmology   I have discontinued Jeneen Rinks Sennett's Na Sulfate-K Sulfate-Mg Sulf, naproxen, and atenolol. I have also changed his JARDIANCE to empagliflozin. Additionally, I am having him maintain his [place], glucose blood, amLODipine, simvastatin, benazepril, Insulin Pen Needle, temazepam, metFORMIN, Exenatide ER, and Insulin Glargine.  Meds ordered this encounter  Medications  . metFORMIN (GLUCOPHAGE) 1000 MG tablet    Sig: TAKE 1 TABLET BY MOUTH TWICE DAILY WITH A MEAL    Dispense:  180 tablet    Refill:  1    . Exenatide ER (BYDUREON BCISE) 2 MG/0.85ML AUIJ    Sig: Inject 1 Act into the skin once a week.    Dispense:  12 pen    Refill:  3  . Insulin Glargine (TOUJEO SOLOSTAR) 300 UNIT/ML SOPN    Sig: Inject 60 Units into the skin daily.    Dispense:  5 pen    Refill:  1  . empagliflozin (JARDIANCE) 10 MG TABS tablet    Sig: Take 10 mg by mouth daily.    Dispense:  90 tablet    Refill:  1     Follow-up: Return in about 4 months (around 05/18/2018).  Scarlette Calico, MD

## 2018-01-26 ENCOUNTER — Telehealth: Payer: Self-pay

## 2018-01-26 NOTE — Telephone Encounter (Signed)
PA has been approved. Can you inform pt of same.  

## 2018-01-26 NOTE — Telephone Encounter (Signed)
Key: [id number]

## 2018-01-30 ENCOUNTER — Other Ambulatory Visit: Payer: Self-pay | Admitting: Internal Medicine

## 2018-01-30 DIAGNOSIS — I10 Essential (primary) hypertension: Secondary | ICD-10-CM

## 2018-01-30 DIAGNOSIS — Z794 Long term (current) use of insulin: Secondary | ICD-10-CM

## 2018-01-30 DIAGNOSIS — E118 Type 2 diabetes mellitus with unspecified complications: Secondary | ICD-10-CM

## 2018-01-30 DIAGNOSIS — E785 Hyperlipidemia, unspecified: Secondary | ICD-10-CM

## 2018-01-30 MED ORDER — AMLODIPINE BESYLATE 10 MG PO TABS: 10.0000 mg | ORAL_TABLET | Freq: Every day | ORAL | 1 refills | Status: AC

## 2018-01-30 MED ORDER — SIMVASTATIN 20 MG PO TABS: 20.0000 mg | ORAL_TABLET | Freq: Every evening | ORAL | 1 refills | Status: AC

## 2018-01-30 NOTE — Telephone Encounter (Signed)
Patient notified

## 2018-02-04 ENCOUNTER — Other Ambulatory Visit: Payer: Self-pay | Admitting: Internal Medicine

## 2018-02-04 DIAGNOSIS — F5101 Primary insomnia: Secondary | ICD-10-CM

## 2018-02-07 ENCOUNTER — Telehealth: Payer: Self-pay

## 2018-02-07 ENCOUNTER — Telehealth: Payer: Self-pay | Admitting: Internal Medicine

## 2018-02-07 ENCOUNTER — Other Ambulatory Visit: Payer: Self-pay | Admitting: Internal Medicine

## 2018-02-07 DIAGNOSIS — E118 Type 2 diabetes mellitus with unspecified complications: Secondary | ICD-10-CM

## 2018-02-07 DIAGNOSIS — Z794 Long term (current) use of insulin: Secondary | ICD-10-CM

## 2018-02-07 MED ORDER — SEMAGLUTIDE (1 MG/DOSE) 2 MG/1.5ML ~~LOC~~ SOPN: 1.0000 | PEN_INJECTOR | SUBCUTANEOUS | 1 refills | Status: DC

## 2018-02-07 NOTE — Telephone Encounter (Signed)
Pharmacy is requesting for the Bydureon 2mg  to be sent in due to the bcise is on back order. Please advise.

## 2018-02-07 NOTE — Telephone Encounter (Signed)
Copied from [place]. Topic: Quick Communication - See Telephone Encounter >> Feb 07, 2018  1:15 PM Arletha Grippe wrote: CRM for notification. See Telephone encounter for:   02/07/18.[facility] called - they need to cahnge Exenatide ER (BYDUREON BCISE) 2 MG/0.85ML AUIJ to another rx.  The Bcise pen is unavailable.  It is on back order long term.  They can do a different Bydueron.  Cb [phone]

## 2018-02-07 NOTE — Telephone Encounter (Signed)
This was taken care of in an earlier encounter noted 02/07/18.

## 2018-02-07 NOTE — Telephone Encounter (Signed)
Change to ozempic

## 2018-02-09 NOTE — Telephone Encounter (Signed)
Pt informed of rx change.  

## 2018-02-23 ENCOUNTER — Other Ambulatory Visit: Payer: Self-pay | Admitting: Internal Medicine

## 2018-02-23 DIAGNOSIS — Z794 Long term (current) use of insulin: Secondary | ICD-10-CM

## 2018-02-23 DIAGNOSIS — E118 Type 2 diabetes mellitus with unspecified complications: Secondary | ICD-10-CM

## 2018-03-06 ENCOUNTER — Other Ambulatory Visit: Payer: Self-pay | Admitting: Internal Medicine

## 2018-04-04 IMAGING — DX DG CHEST 2V
2 series · 2 of 2 positions shown · non-contrast
Comparison: 01/01/1998 CXR report

CLINICAL DATA: Chest pain radiating to shoulder for several days.

EXAM:
CHEST  2 VIEW

[w chest pa]
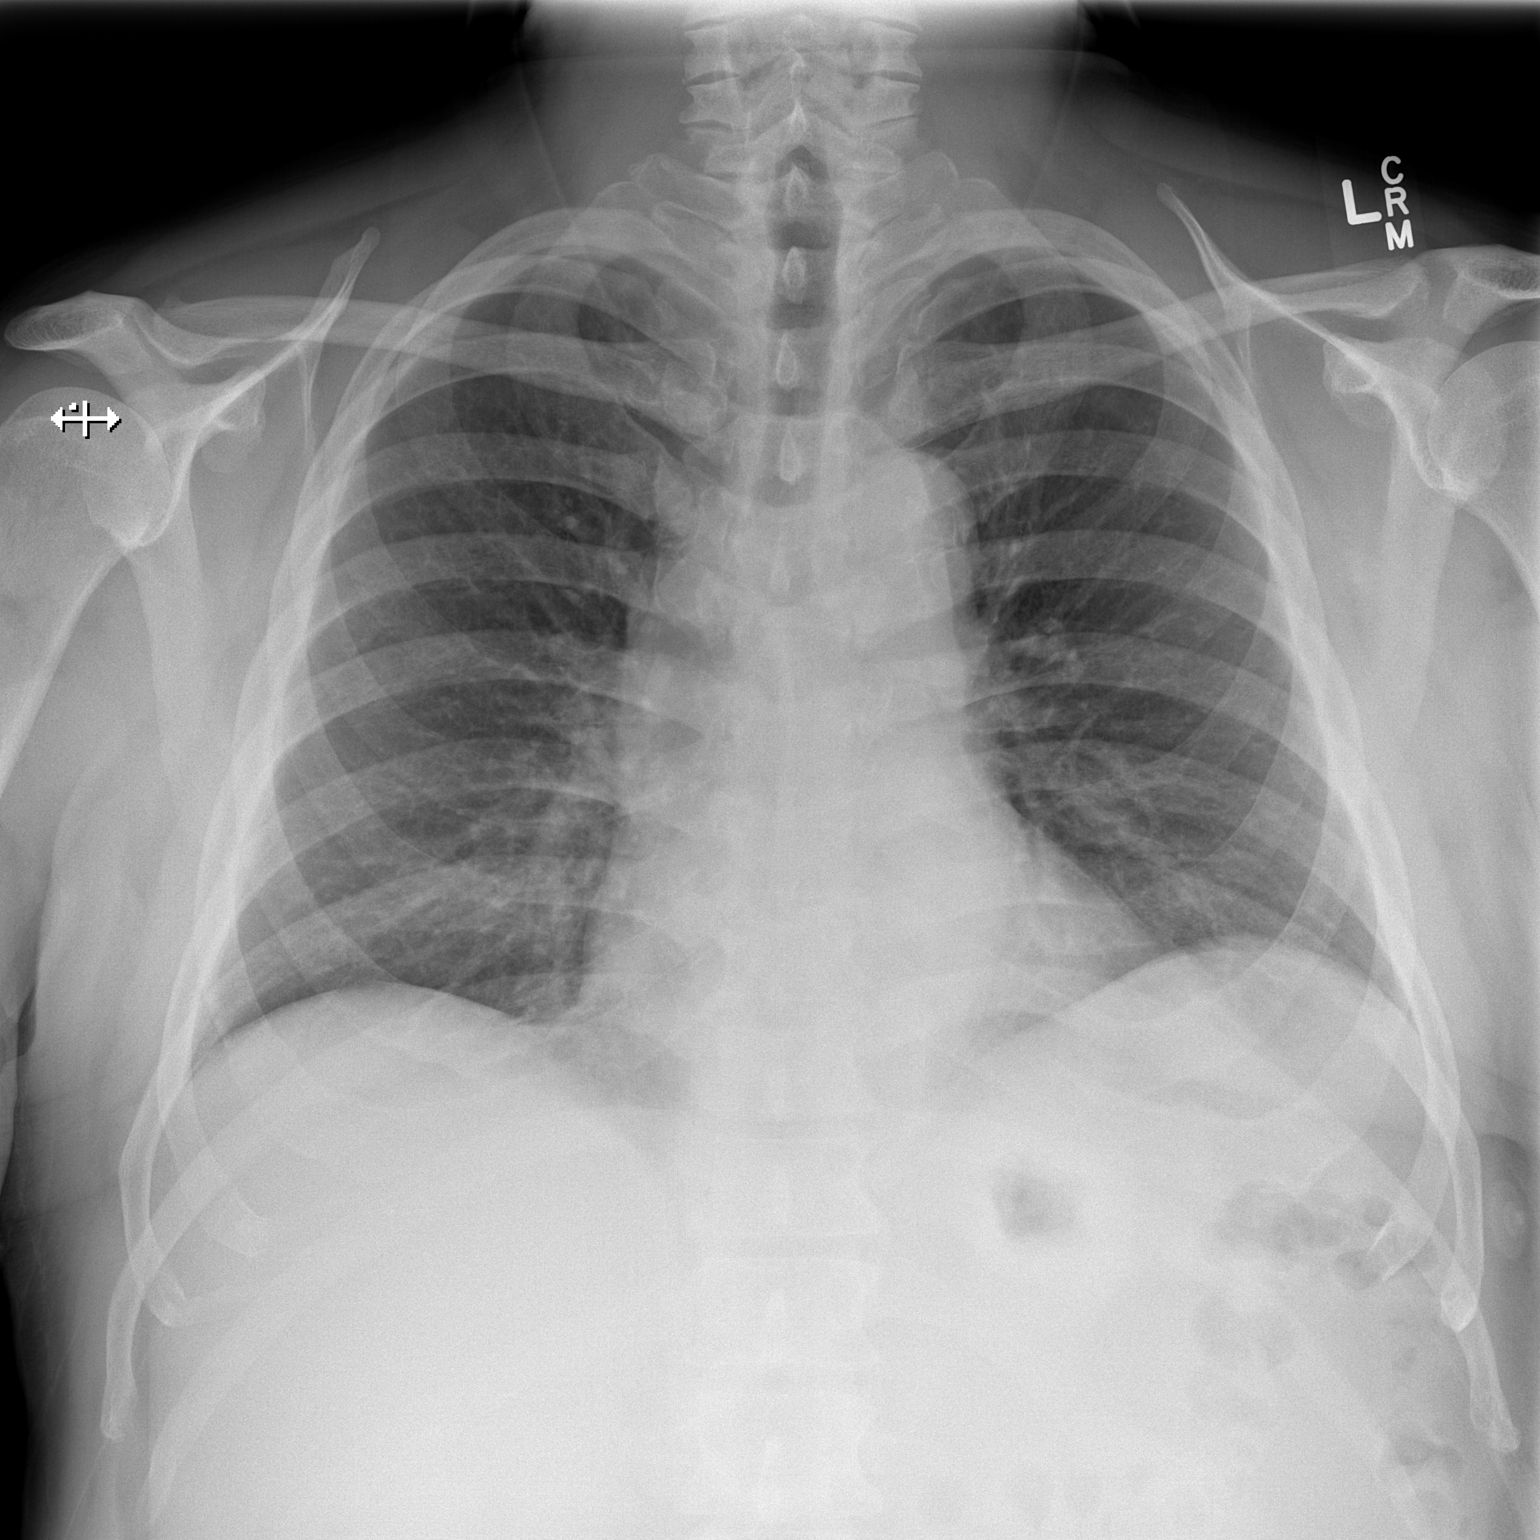

[w chest lat]
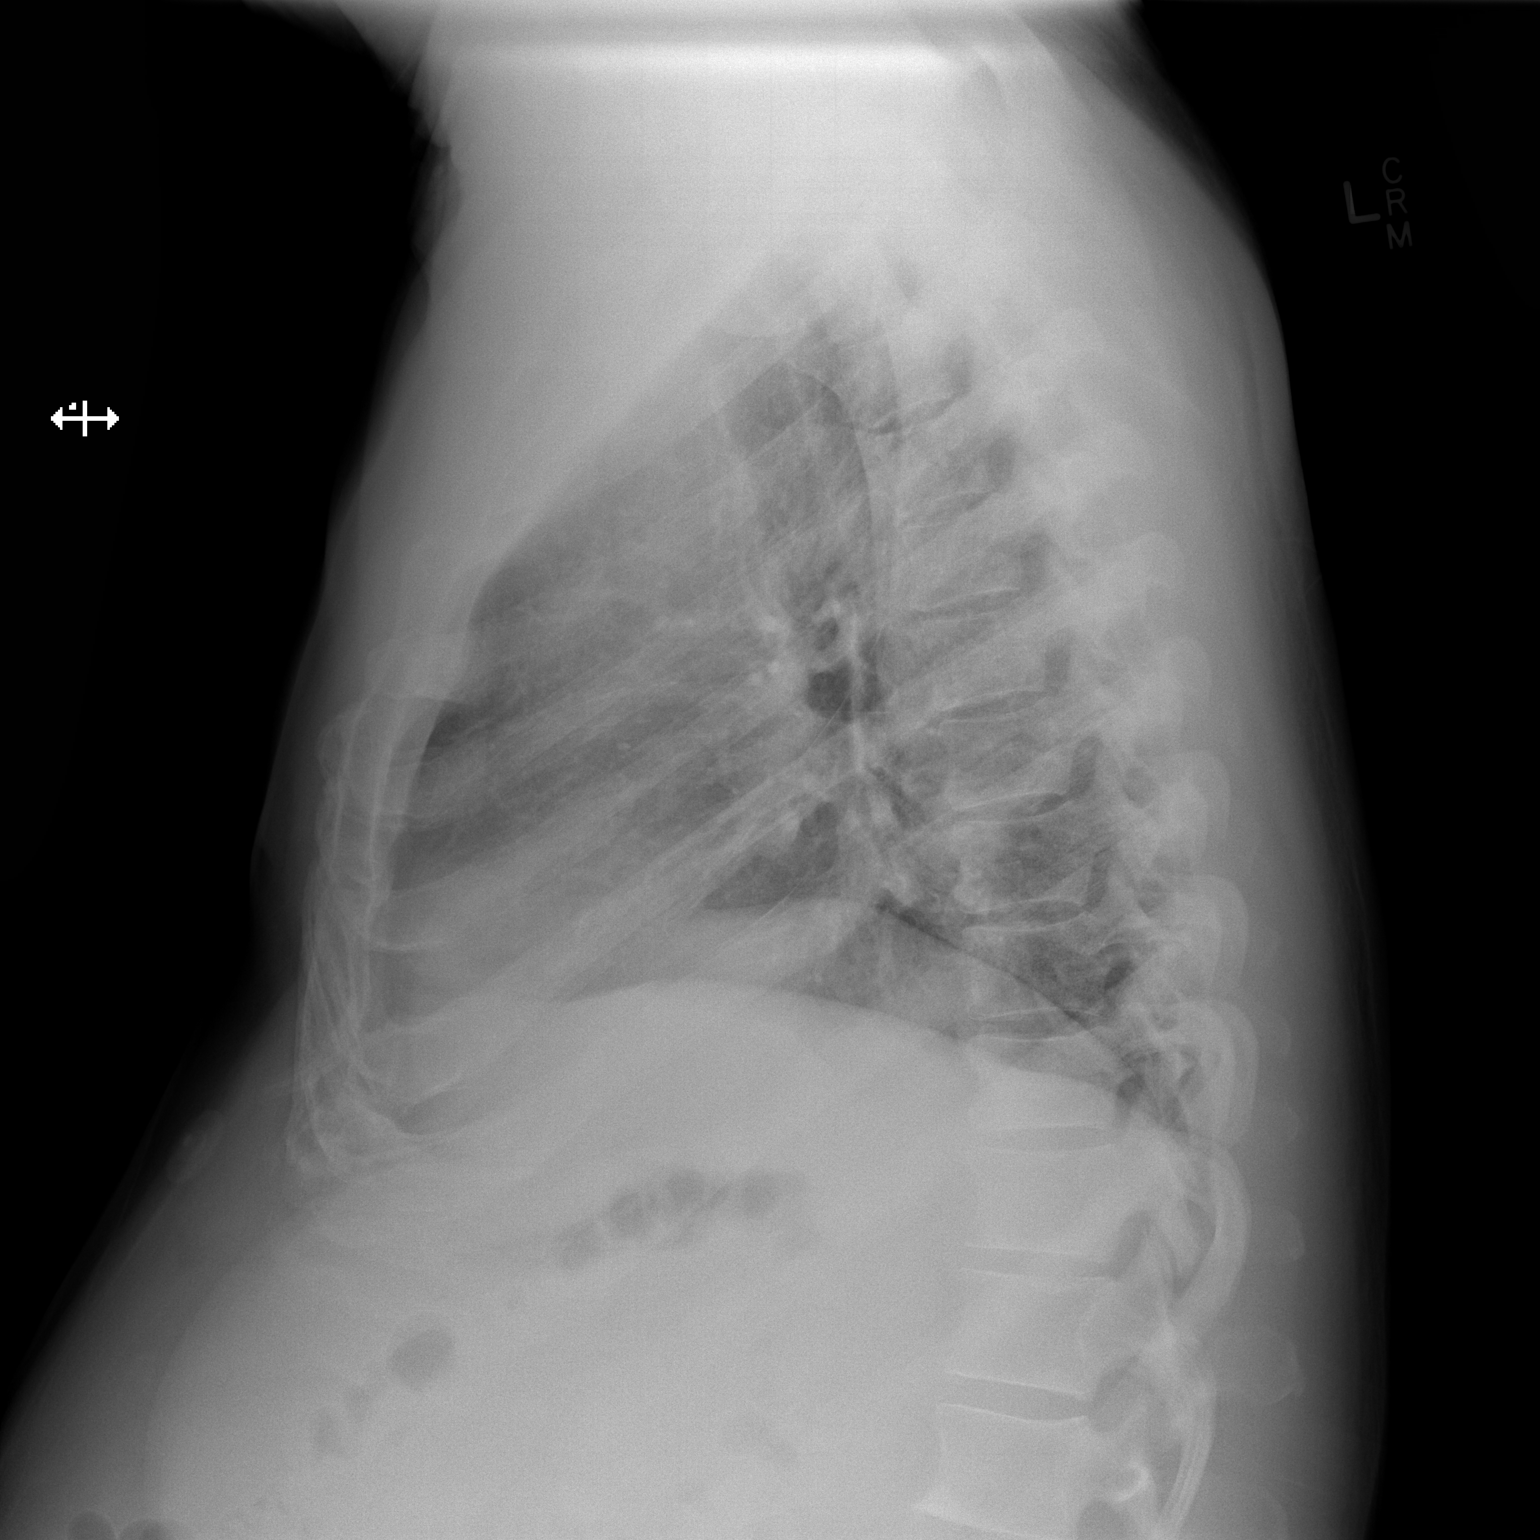

[2 of 2 positions shown; findings below may reference images not displayed]

FINDINGS: The heart size and mediastinal contours are within normal limits.
Both lungs are clear. Minimal degenerative change along the dorsal
spine. No acute osseous abnormality.
IMPRESSION: No active cardiopulmonary disease.

## 2018-05-17 ENCOUNTER — Encounter: Payer: Self-pay | Admitting: Internal Medicine

## 2018-05-17 ENCOUNTER — Ambulatory Visit: Payer: BLUE CROSS/BLUE SHIELD | Admitting: Internal Medicine

## 2018-05-17 VITALS — BP 130/70 | HR 77 | Temp 98.1°F | Resp 16 | Ht 69.0 in | Wt 224.0 lb

## 2018-05-17 DIAGNOSIS — E118 Type 2 diabetes mellitus with unspecified complications: Secondary | ICD-10-CM | POA: Diagnosis not present

## 2018-05-17 DIAGNOSIS — I10 Essential (primary) hypertension: Secondary | ICD-10-CM | POA: Diagnosis not present

## 2018-05-17 DIAGNOSIS — Z0000 Encounter for general adult medical examination without abnormal findings: Secondary | ICD-10-CM

## 2018-05-17 LAB — POCT GLYCOSYLATED HEMOGLOBIN (HGB A1C): Hemoglobin A1C: 9.5 % — AB (ref 4.0–5.6)

## 2018-05-17 MED ORDER — DAPAGLIFLOZIN PROPANEDIOL 10 MG PO TABS: 10.0000 mg | ORAL_TABLET | Freq: Every day | ORAL | 1 refills | Status: AC

## 2018-05-17 NOTE — Patient Instructions (Signed)

## 2018-05-17 NOTE — Progress Notes (Signed)
Subjective:  Patient ID: Patrick Parker, male    DOB: January 29, 1956  Age: 62 y.o. MRN: [id number]  CC: Diabetes and Hypertension   HPI Patrick Parker presents for f/up - He is not using the GLP-1 agonist anymore because he thought it was dropping his blood sugars too low.  He also does not consistently use the insulin.  He thinks his blood sugars have been well controlled recently.  He denies polys.  He tells me his blood pressure has been well controlled and he denies any recent episodes of CP, DOE, palpitations, edema, or fatigue.  Outpatient Medications Prior to Visit  Medication Sig Dispense Refill  . amLODipine (NORVASC) 10 MG tablet Take 1 tablet (10 mg total) by mouth daily. 90 tablet 1  . atenolol (TENORMIN) 25 MG tablet TAKE 2 TABLETS(50 MG) BY MOUTH DAILY 180 tablet 0  . benazepril (LOTENSIN) 20 MG tablet TAKE 1 TABLET(20 MG) BY MOUTH DAILY 90 tablet 1  . Blood Glucose Monitoring Suppl (ONETOUCH VERIO FLEX SYSTEM) w/Device KIT 1 Act by Does not apply route 3 (three) times daily. 2 kit 0  . Insulin Glargine (TOUJEO SOLOSTAR) 300 UNIT/ML SOPN Inject 60 Units into the skin daily. 5 pen 1  . Insulin Pen Needle (B-D UF III MINI PEN NEEDLES) 31G X 5 MM MISC Inject 1 Act into the skin daily. 100 each 0  . metFORMIN (GLUCOPHAGE) 1000 MG tablet TAKE 1 TABLET BY MOUTH TWICE DAILY WITH A MEAL 180 tablet 1  . ONETOUCH VERIO test strip USE TO TEST BLOOD SUGAR AS DIRECTED THREE TIMES DAILY 100 each 5  . simvastatin (ZOCOR) 20 MG tablet Take 1 tablet (20 mg total) by mouth every evening. 90 tablet 1  . temazepam (RESTORIL) 30 MG capsule TAKE 1 CAPSULE BY MOUTH EVERY DAY AT BEDTIME AS NEEDED FOR SLEEP 30 capsule 5  . empagliflozin (JARDIANCE) 10 MG TABS tablet Take 10 mg by mouth daily. 90 tablet 1  . Semaglutide (OZEMPIC) 1 MG/DOSE SOPN Inject 1 Act into the skin once a week. (Patient not taking: Reported on 05/17/2018) 12 pen 1   No facility-administered medications prior to visit.     ROS Review  of Systems  Constitutional: Negative.  Negative for appetite change, diaphoresis and fatigue.  HENT: Negative.   Eyes: Negative for visual disturbance.  Respiratory: Negative for cough, chest tightness, shortness of breath and wheezing.   Cardiovascular: Negative for chest pain, palpitations and leg swelling.  Gastrointestinal: Negative for abdominal pain, constipation, diarrhea, nausea and vomiting.  Endocrine: Negative for polydipsia, polyphagia and polyuria.  Genitourinary: Negative.  Negative for difficulty urinating and frequency.  Musculoskeletal: Negative.  Negative for back pain and myalgias.  Skin: Negative.   Neurological: Negative.  Negative for dizziness, weakness and light-headedness.  Hematological: Negative for adenopathy. Does not bruise/bleed easily.  Psychiatric/Behavioral: Negative.     Objective:  BP 130/70 (BP Location: Left Arm, Patient Position: Sitting, Cuff Size: Normal)   Pulse 77   Temp 98.1 F (36.7 C) (Oral)   Resp 16   Ht '5\' 9"'$  (1.753 m)   Wt 224 lb (101.6 kg)   SpO2 94%   BMI 33.08 kg/m   BP Readings from Last 3 Encounters:  05/17/18 130/70  01/18/18 128/88  07/06/17 140/90    Wt Readings from Last 3 Encounters:  05/17/18 224 lb (101.6 kg)  01/18/18 235 lb (106.6 kg)  07/06/17 220 lb (99.8 kg)    Physical Exam  Constitutional: He is oriented to person, place, and  time. No distress.  HENT:  Mouth/Throat: Oropharynx is clear and moist.  Eyes: Conjunctivae are normal. No scleral icterus.  Neck: Normal range of motion. Neck supple. No JVD present. No thyromegaly present.  Cardiovascular: Normal rate and normal heart sounds. Exam reveals no gallop.  No murmur heard. Pulmonary/Chest: Effort normal and breath sounds normal. He has no wheezes. He has no rales.  Abdominal: Soft. Bowel sounds are normal. He exhibits no mass. There is no hepatosplenomegaly. There is no tenderness. No hernia.  Musculoskeletal: Normal range of motion. He exhibits no  edema, tenderness or deformity.  Lymphadenopathy:    He has no cervical adenopathy.  Neurological: He is alert and oriented to person, place, and time.  Skin: Skin is warm and dry. No rash noted. He is not diaphoretic.  Vitals reviewed.   Lab Results  Component Value Date   WBC 7.4 07/06/2017   HGB 16.5 07/06/2017   HCT 48.6 07/06/2017   PLT 325.0 07/06/2017   GLUCOSE 117 (H) 07/06/2017   CHOL 169 07/06/2017   TRIG 148.0 07/06/2017   HDL 39.10 07/06/2017   LDLDIRECT 91.0 07/21/2016   LDLCALC 100 (H) 07/06/2017   ALT 28 07/06/2017   AST 17 07/06/2017   NA 140 07/06/2017   K 3.9 07/06/2017   CL 105 07/06/2017   CREATININE 1.01 07/06/2017   BUN 19 07/06/2017   CO2 31 07/06/2017   TSH 2.20 07/21/2016   PSA 0.92 07/21/2016   HGBA1C 9.5 (A) 05/17/2018   MICROALBUR 1.5 07/06/2017    Dg Chest 2 View  Result Date: 01/04/2017 CLINICAL DATA:  Chest pain radiating to shoulder for several days. EXAM: CHEST  2 VIEW COMPARISON:  01/01/1998 CXR report FINDINGS: The heart size and mediastinal contours are within normal limits. Both lungs are clear. Minimal degenerative change along the dorsal spine. No acute osseous abnormality. IMPRESSION: No active cardiopulmonary disease. Electronically Signed   By: Ashley Royalty M.D.   On: 01/04/2017 18:07    Assessment & Plan:   [name] was seen today for diabetes and hypertension.  Diagnoses and all orders for this visit:  Essential hypertension-his blood pressure is well controlled.  Electrolytes and renal function are normal. -     Basic metabolic panel; Future  Type 2 diabetes mellitus with complication, without long-term current use of insulin ([place])- His A1c is up to 9.5%.  His blood sugars are not well controlled.  Will discontinue the GLP-1 agonist at his request.  Will upgrade him to a full dose SGLT2 inhibitor.  Will continue metformin and I have asked him to be compliant with the basal insulin therapy as well.  He also agrees to work on his  lifestyle modifications. -     POCT glycosylated hemoglobin (Hb A1C) -     dapagliflozin propanediol (FARXIGA) 10 MG TABS tablet; Take 10 mg by mouth daily. -     Basic metabolic panel; Future  Routine general medical examination at a health care facility   I have discontinued Rustin Beske's empagliflozin and Semaglutide. I am also having him start on dapagliflozin propanediol. Additionally, I am having him maintain his [place], Insulin Pen Needle, metFORMIN, Insulin Glargine, simvastatin, amLODipine, temazepam, ONETOUCH VERIO, benazepril, and atenolol.  Meds ordered this encounter  Medications  . dapagliflozin propanediol (FARXIGA) 10 MG TABS tablet    Sig: Take 10 mg by mouth daily.    Dispense:  90 tablet    Refill:  1     Follow-up: Return in about  4 months (around 09/16/2018).  Scarlette Calico, MD

## 2018-05-18 ENCOUNTER — Telehealth: Payer: Self-pay | Admitting: *Deleted

## 2018-05-18 NOTE — Telephone Encounter (Signed)
Farxiga 10 mg PA initiated via CovermyMeds. Key: [id number]

## 2018-06-03 ENCOUNTER — Other Ambulatory Visit: Payer: Self-pay | Admitting: Internal Medicine

## 2018-07-08 ENCOUNTER — Other Ambulatory Visit: Payer: Self-pay | Admitting: Internal Medicine

## 2018-07-08 DIAGNOSIS — E118 Type 2 diabetes mellitus with unspecified complications: Secondary | ICD-10-CM

## 2018-07-08 DIAGNOSIS — Z794 Long term (current) use of insulin: Secondary | ICD-10-CM

## 2018-07-12 ENCOUNTER — Telehealth: Payer: Self-pay | Admitting: *Deleted

## 2018-07-12 NOTE — Telephone Encounter (Signed)
Farxiga 10 mg PA initiated.

## 2018-07-12 NOTE — Telephone Encounter (Signed)
[facility] Key: [id number] - Rx #: [id number]

## 2018-07-20 NOTE — Telephone Encounter (Signed)
PA has been approved. Can you inform pt of same.  

## 2018-07-20 NOTE — Telephone Encounter (Signed)
LVM informing patient.

## 2018-08-07 ENCOUNTER — Other Ambulatory Visit: Payer: Self-pay | Admitting: Internal Medicine

## 2018-08-07 DIAGNOSIS — Z794 Long term (current) use of insulin: Secondary | ICD-10-CM

## 2018-08-07 DIAGNOSIS — E118 Type 2 diabetes mellitus with unspecified complications: Secondary | ICD-10-CM

## 2018-08-07 MED ORDER — INSULIN GLARGINE 300 UNIT/ML ~~LOC~~ SOPN: 60.0000 [IU] | PEN_INJECTOR | Freq: Every day | SUBCUTANEOUS | 1 refills | Status: AC

## 2018-09-01 ENCOUNTER — Other Ambulatory Visit: Payer: Self-pay | Admitting: Internal Medicine

## 2018-10-24 ENCOUNTER — Other Ambulatory Visit: Payer: Self-pay | Admitting: Internal Medicine

## 2018-12-01 ENCOUNTER — Other Ambulatory Visit: Payer: Self-pay | Admitting: Internal Medicine

## 2024-08-20 ENCOUNTER — Encounter (HOSPITAL_BASED_OUTPATIENT_CLINIC_OR_DEPARTMENT_OTHER): Payer: Self-pay

## 2024-08-20 ENCOUNTER — Ambulatory Visit (HOSPITAL_BASED_OUTPATIENT_CLINIC_OR_DEPARTMENT_OTHER): Admission: RE | Admit: 2024-08-20 | Discharge: 2024-08-20 | Disposition: A | Discharge status: Home / Self Care

## 2024-08-20 VITALS — BP 161/96 | HR 90 | Temp 98.3°F | Resp 20

## 2024-08-20 DIAGNOSIS — H01001 Unspecified blepharitis right upper eyelid: Secondary | ICD-10-CM | POA: Diagnosis not present

## 2024-08-20 MED ORDER — ERYTHROMYCIN 5 MG/GM OP OINT: TOPICAL_OINTMENT | OPHTHALMIC | 0 refills | Status: AC

## 2024-08-21 ENCOUNTER — Encounter: Payer: Self-pay | Admitting: Internal Medicine
